# Patient Record
Sex: Male | Born: 1990 | Race: White | Hispanic: No | Marital: Single | Smoking: Current every day smoker
Health system: Southern US, Community
[De-identification: ages and names within clinical notes are randomized; demographics above are authoritative.]

## PROBLEM LIST (undated history)

## (undated) DIAGNOSIS — F419 Anxiety disorder, unspecified: Secondary | ICD-10-CM

## (undated) DIAGNOSIS — E860 Dehydration: Secondary | ICD-10-CM

## (undated) HISTORY — PX: FINGER SURGERY: SHX640

---
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
## 2011-03-21 ENCOUNTER — Emergency Department (HOSPITAL_COMMUNITY)
Admission: EM | Admit: 2011-03-21 | Discharge: 2011-03-22 | Disposition: A | Discharge status: Home / Self Care | Payer: No Typology Code available for payment source | Attending: Emergency Medicine | Admitting: Emergency Medicine

## 2011-03-21 ENCOUNTER — Emergency Department (HOSPITAL_COMMUNITY): Payer: No Typology Code available for payment source

## 2011-03-21 DIAGNOSIS — T07XXXA Unspecified multiple injuries, initial encounter: Secondary | ICD-10-CM | POA: Insufficient documentation

## 2011-03-21 DIAGNOSIS — M79609 Pain in unspecified limb: Secondary | ICD-10-CM | POA: Insufficient documentation

## 2011-03-21 DIAGNOSIS — IMO0002 Reserved for concepts with insufficient information to code with codable children: Secondary | ICD-10-CM | POA: Insufficient documentation

## 2011-03-21 DIAGNOSIS — M7989 Other specified soft tissue disorders: Secondary | ICD-10-CM | POA: Insufficient documentation

## 2011-03-21 DIAGNOSIS — R220 Localized swelling, mass and lump, head: Secondary | ICD-10-CM | POA: Insufficient documentation

## 2011-03-21 DIAGNOSIS — M25519 Pain in unspecified shoulder: Secondary | ICD-10-CM | POA: Insufficient documentation

## 2011-03-21 DIAGNOSIS — R51 Headache: Secondary | ICD-10-CM | POA: Insufficient documentation

## 2011-03-21 DIAGNOSIS — R404 Transient alteration of awareness: Secondary | ICD-10-CM | POA: Insufficient documentation

## 2011-03-21 DIAGNOSIS — S060X1A Concussion with loss of consciousness of 30 minutes or less, initial encounter: Secondary | ICD-10-CM | POA: Insufficient documentation

## 2011-03-28 LAB — HEMOGLOBIN AND HEMATOCRIT, BLOOD: HCT: 41.2 % (ref 36.0–49.0)

## 2011-06-05 ENCOUNTER — Emergency Department (HOSPITAL_COMMUNITY)
Admission: EM | Admit: 2011-06-05 | Discharge: 2011-06-05 | Disposition: A | Discharge status: Home / Self Care | Payer: BC Managed Care – PPO | Attending: Emergency Medicine | Admitting: Emergency Medicine

## 2011-06-05 DIAGNOSIS — F17200 Nicotine dependence, unspecified, uncomplicated: Secondary | ICD-10-CM | POA: Insufficient documentation

## 2011-06-05 DIAGNOSIS — R112 Nausea with vomiting, unspecified: Secondary | ICD-10-CM | POA: Insufficient documentation

## 2013-07-06 ENCOUNTER — Emergency Department (HOSPITAL_COMMUNITY)
Admission: EM | Admit: 2013-07-06 | Discharge: 2013-07-06 | Disposition: A | Discharge status: Home / Self Care | Payer: Self-pay | Attending: Emergency Medicine | Admitting: Emergency Medicine

## 2013-07-06 ENCOUNTER — Encounter (HOSPITAL_COMMUNITY): Payer: Self-pay | Admitting: *Deleted

## 2013-07-06 DIAGNOSIS — Y939 Activity, unspecified: Secondary | ICD-10-CM | POA: Insufficient documentation

## 2013-07-06 DIAGNOSIS — S39012A Strain of muscle, fascia and tendon of lower back, initial encounter: Secondary | ICD-10-CM

## 2013-07-06 DIAGNOSIS — X58XXXA Exposure to other specified factors, initial encounter: Secondary | ICD-10-CM | POA: Insufficient documentation

## 2013-07-06 DIAGNOSIS — F17200 Nicotine dependence, unspecified, uncomplicated: Secondary | ICD-10-CM | POA: Insufficient documentation

## 2013-07-06 DIAGNOSIS — Y929 Unspecified place or not applicable: Secondary | ICD-10-CM | POA: Insufficient documentation

## 2013-07-06 DIAGNOSIS — S335XXA Sprain of ligaments of lumbar spine, initial encounter: Secondary | ICD-10-CM | POA: Insufficient documentation

## 2013-07-06 MED ORDER — OXYCODONE-ACETAMINOPHEN 5-325 MG PO TABS
1.0000 | ORAL_TABLET | Freq: Once | ORAL | Status: AC
  Administered 2013-07-06: 1 via ORAL
  Filled 2013-07-06: qty 1

## 2013-07-06 MED ORDER — TRAMADOL HCL 50 MG PO TABS: 50.0000 mg | ORAL_TABLET | Freq: Four times a day (QID) | ORAL | Status: DC | PRN

## 2013-07-06 MED ORDER — CYCLOBENZAPRINE HCL 10 MG PO TABS
10.0000 mg | ORAL_TABLET | Freq: Once | ORAL | Status: AC
  Administered 2013-07-06: 10 mg via ORAL
  Filled 2013-07-06: qty 1

## 2013-07-06 MED ORDER — CYCLOBENZAPRINE HCL 10 MG PO TABS: 10.0000 mg | ORAL_TABLET | Freq: Three times a day (TID) | ORAL | Status: DC | PRN

## 2013-07-06 NOTE — ED Notes (Signed)
Back pain since yesterday when moving furniture. Alert, Nad

## 2013-07-06 NOTE — ED Provider Notes (Signed)
History    CSN: [id number] Arrival date & time 07/06/13  1411  First MD Initiated Contact with Patient 07/06/13 1432     Chief Complaint  Patient presents with  . Back Pain   (Consider location/radiation/quality/duration/timing/severity/associated sxs/prior Treatment) Patient is a 22 y.o. male presenting with back pain. The history is provided by the patient.  Back Pain Location:  Lumbar spine Quality:  Aching Radiates to:  Does not radiate Pain severity:  Moderate Pain is:  Same all the time Onset quality:  Gradual Duration:  2 weeks Timing:  Constant Chronicity:  Recurrent Context: not falling, not lifting heavy objects, not recent illness and not twisting   Relieved by:  Nothing Worsened by:  Ambulation, bending, twisting, movement and sitting Ineffective treatments:  OTC medications and NSAIDs Associated symptoms: no abdominal pain, no abdominal swelling, no bladder incontinence, no bowel incontinence, no chest pain, no dysuria, no fever, no headaches, no leg pain, no numbness, no paresthesias, no pelvic pain, no perianal numbness, no tingling and no weakness    History reviewed. No pertinent past medical history. Past Surgical History  Procedure Laterality Date  . Finger surgery     No family history on file. History  Substance Use Topics  . Smoking status: Current Every Day Smoker  . Smokeless tobacco: Not on file  . Alcohol Use: No    Review of Systems  Constitutional: Negative for fever.  Respiratory: Negative for shortness of breath.   Cardiovascular: Negative for chest pain.  Gastrointestinal: Negative for vomiting, abdominal pain, constipation and bowel incontinence.  Genitourinary: Negative for bladder incontinence, dysuria, hematuria, flank pain, decreased urine volume, difficulty urinating and pelvic pain.       No perineal numbness or incontinence of urine or feces  Musculoskeletal: Positive for back pain. Negative for joint swelling.  Skin: Negative  for rash.  Neurological: Negative for tingling, weakness, numbness, headaches and paresthesias.  All other systems reviewed and are negative.    Allergies  Review of patient's allergies indicates no known allergies.  Home Medications   Current Outpatient Rx  Name  Route  Sig  Dispense  Refill  . acetaminophen (TYLENOL) 500 MG tablet   Oral   Take 1,000 mg by mouth every 6 (six) hours as needed for pain.         Marland Kitchen ibuprofen (ADVIL,MOTRIN) 200 MG tablet   Oral   Take 800 mg by mouth every 6 (six) hours as needed for pain.         Marland Kitchen PRESCRIPTION MEDICATION   Oral   Take 1 tablet by mouth once. Pt took grandma medication. Pt does not know name of med. Med was for spasm.          BP 119/95  Pulse 87  Temp(Src) 98 F (36.7 C) (Oral)  Resp 20  Ht 5\' 10"  (1.778 m)  Wt 190 lb (86.183 kg)  BMI 27.26 kg/m2  SpO2 100% Physical Exam  Nursing note and vitals reviewed. Constitutional: He is oriented to person, place, and time. He appears well-developed and well-nourished. No distress.  HENT:  Head: Normocephalic and atraumatic.  Neck: Normal range of motion. Neck supple.  Cardiovascular: Normal rate, regular rhythm, normal heart sounds and intact distal pulses.   No murmur heard. Pulmonary/Chest: Effort normal and breath sounds normal. No respiratory distress.  Abdominal: Soft. He exhibits no distension. There is no tenderness. There is no rebound and no guarding.  Musculoskeletal: He exhibits tenderness. He exhibits no edema.  Lumbar back: He exhibits tenderness and pain. He exhibits normal range of motion, no swelling, no deformity, no laceration and normal pulse.  ttp of the  lumbar paraspinal muscles.  No spinal tenderness.  DP pulses are brisk and symmetrical.  Distal sensation intact.  Hip Flexors/Extensors are intact  Neurological: He is alert and oriented to person, place, and time. No cranial nerve deficit or sensory deficit. He exhibits normal muscle tone.  Coordination and gait normal.  Reflex Scores:      Patellar reflexes are 2+ on the right side and 2+ on the left side.      Achilles reflexes are 2+ on the right side and 2+ on the left side. Skin: Skin is warm and dry.    ED Course  Procedures (including critical care time) Labs Reviewed - No data to display   MDM     Patient reviewed on the [place] narcotics database.  No recent narcotics filled.     Patient has ttp of the lumbar spine and paraspinal muscles, has hx of chronic low back pain that been worsening for 2 weeks..  No focal neuro deficits on exam.  Ambulates with a steady gait.  Negative SLR bilaterally.  VSS, non-toxic appearing.  Appears stable for discharge.  Doubt emergent neurological or infectious process.  pt agrees to rest, ice and close f/u , resource info given.    Isyss Espinal L. Trisha Mangle, PA-C 07/07/13 2145

## 2013-07-06 NOTE — ED Notes (Signed)
Chronic back pain which is worse x 2 weeks.

## 2013-07-10 NOTE — ED Provider Notes (Signed)
Medical screening examination/treatment/procedure(s) were performed by non-physician practitioner and as supervising physician I was immediately available for consultation/collaboration.  Charles B. Sheldon, MD 07/10/13 1655 

## 2013-07-31 ENCOUNTER — Encounter (HOSPITAL_COMMUNITY): Payer: Self-pay | Admitting: Emergency Medicine

## 2013-07-31 ENCOUNTER — Emergency Department (HOSPITAL_COMMUNITY): Payer: Self-pay

## 2013-07-31 ENCOUNTER — Emergency Department (HOSPITAL_COMMUNITY)
Admission: EM | Admit: 2013-07-31 | Discharge: 2013-07-31 | Disposition: A | Discharge status: Home / Self Care | Payer: Self-pay | Attending: Emergency Medicine | Admitting: Emergency Medicine

## 2013-07-31 DIAGNOSIS — W230XXA Caught, crushed, jammed, or pinched between moving objects, initial encounter: Secondary | ICD-10-CM | POA: Insufficient documentation

## 2013-07-31 DIAGNOSIS — Z9889 Other specified postprocedural states: Secondary | ICD-10-CM | POA: Insufficient documentation

## 2013-07-31 DIAGNOSIS — S62639A Displaced fracture of distal phalanx of unspecified finger, initial encounter for closed fracture: Secondary | ICD-10-CM | POA: Insufficient documentation

## 2013-07-31 DIAGNOSIS — Y9289 Other specified places as the place of occurrence of the external cause: Secondary | ICD-10-CM | POA: Insufficient documentation

## 2013-07-31 DIAGNOSIS — F17200 Nicotine dependence, unspecified, uncomplicated: Secondary | ICD-10-CM | POA: Insufficient documentation

## 2013-07-31 DIAGNOSIS — S62606A Fracture of unspecified phalanx of right little finger, initial encounter for closed fracture: Secondary | ICD-10-CM

## 2013-07-31 DIAGNOSIS — Y9389 Activity, other specified: Secondary | ICD-10-CM | POA: Insufficient documentation

## 2013-07-31 MED ORDER — OXYCODONE-ACETAMINOPHEN 5-325 MG PO TABS
1.0000 | ORAL_TABLET | Freq: Once | ORAL | Status: AC
  Administered 2013-07-31: 1 via ORAL
  Filled 2013-07-31: qty 1

## 2013-07-31 MED ORDER — DICLOFENAC SODIUM 75 MG PO TBEC: 75.0000 mg | DELAYED_RELEASE_TABLET | Freq: Two times a day (BID) | ORAL | Status: DC

## 2013-07-31 MED ORDER — ONDANSETRON HCL 4 MG PO TABS
4.0000 mg | ORAL_TABLET | Freq: Once | ORAL | Status: AC
  Administered 2013-07-31: 4 mg via ORAL
  Filled 2013-07-31: qty 1

## 2013-07-31 MED ORDER — OXYCODONE-ACETAMINOPHEN 5-325 MG PO TABS: 1.0000 | ORAL_TABLET | Freq: Four times a day (QID) | ORAL | Status: DC | PRN

## 2013-07-31 MED ORDER — KETOROLAC TROMETHAMINE 10 MG PO TABS
10.0000 mg | ORAL_TABLET | Freq: Once | ORAL | Status: AC
  Administered 2013-07-31: 10 mg via ORAL
  Filled 2013-07-31: qty 1

## 2013-07-31 NOTE — ED Provider Notes (Signed)
CSN: [id number]     Arrival date & time 07/31/13  1704 History     First MD Initiated Contact with Patient 07/31/13 1808     Chief Complaint  Patient presents with  . Finger Injury   (Consider location/radiation/quality/duration/timing/severity/associated sxs/prior Treatment) Patient is a 22 y.o. male presenting with hand pain. The history is provided by the patient.  Hand Pain This is a new problem. The current episode started today. The problem occurs constantly. The problem has been gradually worsening. Pertinent negatives include no abdominal pain, arthralgias, chest pain, coughing, neck pain or numbness. Exacerbated by: movement and palpation. He has tried acetaminophen for the symptoms. The treatment provided no relief.    History reviewed. No pertinent past medical history. Past Surgical History  Procedure Laterality Date  . Finger surgery    . Finger surgery     No family history on file. History  Substance Use Topics  . Smoking status: Current Every Day Smoker  . Smokeless tobacco: Not on file  . Alcohol Use: No    Review of Systems  Constitutional: Negative for activity change.       All ROS Neg except as noted in HPI  HENT: Negative for nosebleeds and neck pain.   Eyes: Negative for photophobia and discharge.  Respiratory: Negative for cough, shortness of breath and wheezing.   Cardiovascular: Negative for chest pain and palpitations.  Gastrointestinal: Negative for abdominal pain and blood in stool.  Genitourinary: Negative for dysuria, frequency and hematuria.  Musculoskeletal: Negative for back pain and arthralgias.  Skin: Negative.   Neurological: Negative for dizziness, seizures, speech difficulty and numbness.  Psychiatric/Behavioral: Negative for hallucinations and confusion.    Allergies  Review of patient's allergies indicates no known allergies.  Home Medications   Current Outpatient Rx  Name  Route  Sig  Dispense  Refill  . acetaminophen  (TYLENOL) 500 MG tablet   Oral   Take 1,000 mg by mouth every 6 (six) hours as needed for pain.         . cyclobenzaprine (FLEXERIL) 10 MG tablet   Oral   Take 1 tablet (10 mg total) by mouth 3 (three) times daily as needed for muscle spasms.   21 tablet   0   . diclofenac (VOLTAREN) 75 MG EC tablet   Oral   Take 1 tablet (75 mg total) by mouth 2 (two) times daily.   12 tablet   0   . ibuprofen (ADVIL,MOTRIN) 200 MG tablet   Oral   Take 800 mg by mouth every 6 (six) hours as needed for pain.         Marland Kitchen oxyCODONE-acetaminophen (PERCOCET/ROXICET) 5-325 MG per tablet   Oral   Take 1 tablet by mouth every 6 (six) hours as needed for pain.   20 tablet   0   . PRESCRIPTION MEDICATION   Oral   Take 1 tablet by mouth once. Pt took grandma medication. Pt does not know name of med. Med was for spasm.         . traMADol (ULTRAM) 50 MG tablet   Oral   Take 1 tablet (50 mg total) by mouth every 6 (six) hours as needed for pain.   15 tablet   0    BP 138/72  Pulse 93  Temp(Src) 98.3 F (36.8 C) (Oral)  Resp 18  SpO2 100% Physical Exam  Nursing note and vitals reviewed. Constitutional: He is oriented to person, place, and time. He appears well-developed and  well-nourished.  Non-toxic appearance.  HENT:  Head: Normocephalic.  Right Ear: Tympanic membrane and external ear normal.  Left Ear: Tympanic membrane and external ear normal.  Eyes: EOM and lids are normal. Pupils are equal, round, and reactive to light.  Neck: Normal range of motion. Neck supple. Carotid bruit is not present.  Cardiovascular: Normal rate, regular rhythm, normal heart sounds, intact distal pulses and normal pulses.   Pulmonary/Chest: Breath sounds normal. No respiratory distress.  Abdominal: Soft. Bowel sounds are normal. There is no tenderness. There is no guarding.  Musculoskeletal: Normal range of motion.  There is full range of motion of the right shoulder elbow and wrist. The radial pulses 2+  on the right. There is pain and swelling of the DIP joint on the on the right. There is mild extension deformity appreciated. The capillary refill is less than 3 seconds.  Lymphadenopathy:       Head (right side): No submandibular adenopathy present.       Head (left side): No submandibular adenopathy present.    He has no cervical adenopathy.  Neurological: He is alert and oriented to person, place, and time. He has normal strength. No cranial nerve deficit or sensory deficit.  Skin: Skin is warm and dry.  Psychiatric: He has a normal mood and affect. His speech is normal.    ED Course   Procedures (including critical care time)  Labs Reviewed - No data to display Dg Finger Little Right  07/31/2013   *RADIOLOGY REPORT*  Clinical Data: Trauma.  Pain at distal interphalangeal joint.  RIGHT LITTLE FINGER 2+V  Comparison: None.  Findings: Soft tissue swelling centered about the distal interphalangeal joint.  A fracture involves the dorsal aspect of the proximal most portion of the distal phalanx.  This is intra- articular, minimally displaced.  IMPRESSION: Distal phalangeal fracture, intra-articular.   Original Report Authenticated By: Jeronimo Greaves, M.D.   1. Fracture of phalanx of right little finger, closed, initial encounter     MDM  **I have reviewed nursing notes, vital signs, and all appropriate lab and imaging results for this patient.* Patient had the right fifth finger caught in a car door earlier this morning. He notice more and more pain and swelling, as well as difficulty extending to the distal portion of the finger.  X-ray of the right fifth finger reveals a distal pharyngeal fracture that is intra-articular. The patient is fitted with a buddy tape splint. He is referred to orthopedics. The patient is advised to apply ice, he is given a prescription for diclofenac and Percocet.  Kathie Dike, PA-C 07/31/13 [phone]

## 2013-07-31 NOTE — ED Notes (Signed)
States that he injured his right 5th digit this morning, states that he caught his finger in his car door.

## 2013-08-03 ENCOUNTER — Emergency Department (HOSPITAL_COMMUNITY)
Admission: EM | Admit: 2013-08-03 | Discharge: 2013-08-03 | Disposition: A | Discharge status: Home / Self Care | Payer: Self-pay | Attending: Emergency Medicine | Admitting: Emergency Medicine

## 2013-08-03 ENCOUNTER — Encounter (HOSPITAL_COMMUNITY): Payer: Self-pay | Admitting: *Deleted

## 2013-08-03 ENCOUNTER — Telehealth: Payer: Self-pay | Admitting: Orthopedic Surgery

## 2013-08-03 DIAGNOSIS — M79609 Pain in unspecified limb: Secondary | ICD-10-CM | POA: Insufficient documentation

## 2013-08-03 DIAGNOSIS — R209 Unspecified disturbances of skin sensation: Secondary | ICD-10-CM | POA: Insufficient documentation

## 2013-08-03 DIAGNOSIS — S62609S Fracture of unspecified phalanx of unspecified finger, sequela: Secondary | ICD-10-CM

## 2013-08-03 DIAGNOSIS — F17200 Nicotine dependence, unspecified, uncomplicated: Secondary | ICD-10-CM | POA: Insufficient documentation

## 2013-08-03 MED ORDER — OXYCODONE-ACETAMINOPHEN 5-325 MG PO TABS: 1.0000 | ORAL_TABLET | ORAL | Status: DC | PRN

## 2013-08-03 NOTE — Telephone Encounter (Signed)
Received call from Burgess Amor, [place], at [facility] : [facility] Emergency department, states "Courtesy call" regarding this patient, and follow up for appointment for finger injury/fracture, for which patient presents again today, 08/03/13, to the Emergency room. I relayed that I did not see a phone note in which we spoke with him in the medical chart, but assured her if we haven't scheduled, we would be glad to schedule first available, and would be needed to verify insurance and/or all information needed for the appointment.  With further research, noted that patient is scheduled here for an appointment with Dr Romeo Apple on 08/10/13.  I have tried calling patient back to offer appointment for an earlier date, in a cancellation slot; per ph# in [facility] chart - ph # [phone], it is a non-working number.  I have in turn called and left a voice message at the alternate contact phone # [phone], to follow up with our office regarding the appointment.

## 2013-08-03 NOTE — Progress Notes (Signed)
ED/CM noted pt did not have health insurance, and/or PCP. Patient was given the ED [facility] uninsured handout with information for the clinics, food pantries, and the handout for insurance sign-up. Patient expressed appreciation for this.

## 2013-08-03 NOTE — ED Provider Notes (Signed)
Medical screening examination/treatment/procedure(s) were performed by non-physician practitioner and as supervising physician I was immediately available for consultation/collaboration.   Sosha Shepherd M Harmoney Sienkiewicz, DO 08/03/13 2219 

## 2013-08-03 NOTE — ED Provider Notes (Signed)
CSN: [id number]     Arrival date & time 08/03/13  1335 History     First MD Initiated Contact with Patient 08/03/13 1349     Chief Complaint  Patient presents with  . Finger Injury   (Consider location/radiation/quality/duration/timing/severity/associated sxs/prior Treatment) HPI Comments: Troy Clayton is a 22 y.o. Male pending for a recheck of his right fifth finger fracture for which he was seen here 3 days ago.  He reports constant but not worsened pain at the site and has run out of his oxycodone.  He is still taking voltaren which is not relieving his pain.  He has tried to get in with Dr. Romeo Apple for further management of this injury,  But was told it will be next week before he can get seen.  He has persistent pain described as throbbing but does not radiate.  He also has some decreased sensation to fine touch in the distal fingertip.      The history is provided by the patient.    History reviewed. No pertinent past medical history. Past Surgical History  Procedure Laterality Date  . Finger surgery    . Finger surgery     No family history on file. History  Substance Use Topics  . Smoking status: Current Every Day Smoker  . Smokeless tobacco: Not on file  . Alcohol Use: No    Review of Systems  Constitutional: Negative for fever.  Musculoskeletal: Positive for arthralgias. Negative for myalgias and joint swelling.  Neurological: Positive for numbness. Negative for weakness.    Allergies  Review of patient's allergies indicates no known allergies.  Home Medications   Current Outpatient Rx  Name  Route  Sig  Dispense  Refill  . acetaminophen (TYLENOL) 500 MG tablet   Oral   Take 1,000 mg by mouth every 6 (six) hours as needed for pain.         Marland Kitchen diclofenac (VOLTAREN) 75 MG EC tablet   Oral   Take 1 tablet (75 mg total) by mouth 2 (two) times daily.   12 tablet   0   . ibuprofen (ADVIL,MOTRIN) 200 MG tablet   Oral   Take 800 mg by mouth every 6  (six) hours as needed for pain.         Marland Kitchen oxyCODONE-acetaminophen (PERCOCET/ROXICET) 5-325 MG per tablet   Oral   Take 1 tablet by mouth every 6 (six) hours as needed for pain.   20 tablet   0    BP 111/60  Pulse 71  Temp(Src) 97.3 F (36.3 C) (Oral)  Resp 20  Ht 5\' 10"  (1.778 m)  Wt 190 lb (86.183 kg)  BMI 27.26 kg/m2  SpO2 100% Physical Exam  Constitutional: He appears well-developed and well-nourished.  HENT:  Head: Atraumatic.  Neck: Normal range of motion.  Cardiovascular:  Pulses equal bilaterally  Musculoskeletal: He exhibits tenderness.  ttp right 5th finger at dip joint, mild edema.  Less than 3 sec cap refill.  Patient has sensation in finger tip,  Reduced compared to the 4th.    Neurological: He is alert. He has normal strength. He displays normal reflexes. No sensory deficit.  Equal strength  Skin: Skin is warm and dry.  Psychiatric: He has a normal mood and affect.    ED Course   Procedures (including critical care time)  Labs Reviewed - No data to display No results found. No diagnosis found.  MDM  Patients labs and/or radiological studies were viewed and considered during  the medical decision making and disposition process. Xray from prior visit reviewed.  Call placed to Dr Harrison's office to attempt to get appt time - per Okey Regal, no record of patient having called.  Encouraged pt to call for definitive tx of this injury.  He states he will call today for this.  He was placed in a new finger splint.  #10 oxycodone,  Continue with voltaren,  Ice, elevation.  The patient appears reasonably screened and/or stabilized for discharge and I doubt any other medical condition or other [facility] requiring further screening, evaluation, or treatment in the ED at this time prior to discharge.   Burgess Amor, PA-C 08/03/13 1623

## 2013-08-03 NOTE — ED Notes (Signed)
Recheck of fx to right 5th finger. Pt states he is unable to get in to see ortho for another week or two and unable to set up an appt, per pt. States finger is still throbbing.

## 2013-08-05 NOTE — ED Provider Notes (Signed)
History/physical exam/procedure(s) were performed by non-physician practitioner and as supervising physician I was immediately available for consultation/collaboration. I have reviewed all notes and am in agreement with care and plan.   Azalie Harbeck S Ramey Ketcherside, MD 08/05/13 1242 

## 2013-08-10 ENCOUNTER — Encounter: Payer: Self-pay | Admitting: Orthopedic Surgery

## 2013-08-10 ENCOUNTER — Ambulatory Visit (INDEPENDENT_AMBULATORY_CARE_PROVIDER_SITE_OTHER): Payer: Self-pay | Admitting: Orthopedic Surgery

## 2013-08-10 VITALS — BP 122/96 | Ht 70.0 in | Wt 204.0 lb

## 2013-08-10 DIAGNOSIS — M20019 Mallet finger of unspecified finger(s): Secondary | ICD-10-CM

## 2013-08-10 DIAGNOSIS — M20011 Mallet finger of right finger(s): Secondary | ICD-10-CM

## 2013-08-10 MED ORDER — HYDROCODONE-ACETAMINOPHEN 5-325 MG PO TABS: 1.0000 | ORAL_TABLET | ORAL | Status: DC | PRN

## 2013-08-10 NOTE — Patient Instructions (Addendum)
Mallet finger  Mallet Finger You have a hand injury called mallet finger. This means that the tendon which straightens the fingertip is torn. This injury usually results from jamming the end of the finger. A small fracture at the joint may also be present. Most of the time, this injury can be treated with a simple splint. Keep your finger elevated and use an icepack on it every 2-3 hours for several days to help reduce pain and to keep the swelling down. Although the injury often causes a deformity at the joint, surgery is not needed if a splint is used properly. A properly fit special finger splint will hold the tendon ends together until the tendon is healed. You must wear this splint continuously for at least 6-8 weeks, or you may end up with a permanent deformity. If the end joint of the finger bends at any time before healing is complete, the treatment may fail. Please do not remove this splint until your doctor approves. Be sure to see the doctor for follow-up care as recommended. If your finger becomes more painful, swollen, or red, you should see your doctor right away. MAKE SURE YOU:   Understand these instructions.  Will watch your condition.  Will get help right away if you are not doing well or get worse. Document Released: 01/15/2005 Document Revised: 03/01/2012 Document Reviewed: 12/08/2005 [facility] Patient Information 2014 [place], [place].

## 2013-08-10 NOTE — Progress Notes (Signed)
Patient ID: Troy Clayton, male   DOB: 15-Jun-1991, 22 y.o.   MRN: [id number]  Chief Complaint  Patient presents with  . Hand Pain    Right pinky finger broken d/t injury 07/31/13.    Right hand injury August 10 Initial visit to the hospital repeat visit on August 15 Symptoms sharp throbbing constant pain Pain level X out of 10 questionable Complains of tingling locking swelling numbness deformity  Review of systems positive for nervousness anxiety stiffness joint pain denies shortness of breath chest pain or frequency  Past Surgical History  Procedure Laterality Date  . Finger surgery    . Finger surgery      No past medical history on file.  BP 122/96  Ht 5\' 10"  (1.778 m)  Wt 204 lb (92.534 kg)  BMI 29.27 kg/m2  General appearance is normal he is oriented x3 his mood is normal his gait is normal his right DIP joint is in flexion has no active extension joint is stable skin is intact passive range of motion is normal he is tender over the DIP joint capillary refill is normal sensation is normal x-ray shows bony mallet less than 50% of the joint less than 30% of the joint actually  Splint  OT for mallet finger protocol Splint constantly for 6 weeks and 4 weeks of nighttime splinting Return 6 weeks  Bony mallet injury right small finger DIP joint

## 2013-08-10 NOTE — Telephone Encounter (Signed)
Patient came today, 08/10/13 for originally scheduled appointment.  No call back received to previous calls.

## 2013-08-17 ENCOUNTER — Ambulatory Visit (HOSPITAL_COMMUNITY)
Admission: RE | Admit: 2013-08-17 | Discharge: 2013-08-17 | Disposition: A | Discharge status: Home / Self Care | Payer: Self-pay | Source: Ambulatory Visit | Attending: Orthopedic Surgery | Admitting: Orthopedic Surgery

## 2013-08-17 DIAGNOSIS — M79609 Pain in unspecified limb: Secondary | ICD-10-CM | POA: Insufficient documentation

## 2013-08-17 DIAGNOSIS — M20019 Mallet finger of unspecified finger(s): Secondary | ICD-10-CM | POA: Insufficient documentation

## 2013-08-17 DIAGNOSIS — IMO0001 Reserved for inherently not codable concepts without codable children: Secondary | ICD-10-CM | POA: Insufficient documentation

## 2013-08-17 DIAGNOSIS — Z4689 Encounter for fitting and adjustment of other specified devices: Secondary | ICD-10-CM | POA: Insufficient documentation
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
## 2013-08-24 ENCOUNTER — Other Ambulatory Visit: Payer: Self-pay | Admitting: *Deleted

## 2013-08-24 DIAGNOSIS — M20011 Mallet finger of right finger(s): Secondary | ICD-10-CM

## 2013-08-24 MED ORDER — HYDROCODONE-ACETAMINOPHEN 5-325 MG PO TABS: 1.0000 | ORAL_TABLET | ORAL | Status: DC | PRN

## 2013-09-20 ENCOUNTER — Encounter: Payer: Self-pay | Admitting: Orthopedic Surgery

## 2013-09-20 ENCOUNTER — Ambulatory Visit (INDEPENDENT_AMBULATORY_CARE_PROVIDER_SITE_OTHER): Payer: Self-pay | Admitting: Orthopedic Surgery

## 2013-09-20 VITALS — BP 153/87 | Ht 70.0 in | Wt 204.0 lb

## 2013-09-20 DIAGNOSIS — M20011 Mallet finger of right finger(s): Secondary | ICD-10-CM

## 2013-09-20 DIAGNOSIS — M20019 Mallet finger of unspecified finger(s): Secondary | ICD-10-CM

## 2013-09-20 MED ORDER — HYDROCODONE-ACETAMINOPHEN 5-325 MG PO TABS: 1.0000 | ORAL_TABLET | ORAL | Status: DC | PRN

## 2013-09-20 NOTE — Progress Notes (Signed)
Patient ID: Troy Clayton, male   DOB: 07/07/1991, 22 y.o.   MRN: [id number]  Chief Complaint  Patient presents with  . Follow-up    6 week recheck right hand mallet finger DOI 07/31/13    Followup right small finger mallet deformity. He should splinting as asked. He still has a slight flexion deformity at the tip of the finger some soreness at the DIP joint  Recommend nighttime splinting for 4 weeks come back for recheck in final evaluation  Encounter Diagnosis  Name Primary?  . Mallet finger, right Yes    Meds ordered this encounter  Medications  . HYDROcodone-acetaminophen (NORCO/VICODIN) 5-325 MG per tablet    Sig: Take 1 tablet by mouth every 4 (four) hours as needed for pain.    Dispense:  80 tablet    Refill:  0

## 2013-10-03 ENCOUNTER — Encounter: Payer: Self-pay | Admitting: Orthopedic Surgery

## 2013-10-12 ENCOUNTER — Telehealth: Payer: Self-pay | Admitting: Orthopedic Surgery

## 2013-10-12 NOTE — Telephone Encounter (Signed)
Troy Clayton asked for a prescriptoin for Hydrocodone.  He has an appointment 11/03/13 (resch from 10/18/13)  His # [phone]

## 2013-10-13 ENCOUNTER — Other Ambulatory Visit: Payer: Self-pay | Admitting: Orthopedic Surgery

## 2013-10-13 MED ORDER — ACETAMINOPHEN-CODEINE #3 300-30 MG PO TABS: 1.0000 | ORAL_TABLET | ORAL | Status: DC | PRN

## 2013-10-13 NOTE — Telephone Encounter (Signed)
Left message for [place] to call office

## 2013-10-13 NOTE — Telephone Encounter (Signed)
Patient picked up prescription.

## 2013-10-18 ENCOUNTER — Telehealth: Payer: Self-pay | Admitting: Orthopedic Surgery

## 2013-10-18 ENCOUNTER — Ambulatory Visit: Payer: Self-pay | Admitting: Orthopedic Surgery

## 2013-10-18 NOTE — Telephone Encounter (Signed)
Tammy, please call Matheu Ploeger at [phone], he has a question about his medicine

## 2013-10-18 NOTE — Telephone Encounter (Signed)
Returned call to patient, and he is c/o Tylenol #3 is making him sick. He states it is making him vomit. He states he is trying to work, but finger is still swelling and hurting. He wants to know if he can get prescription for hydrocodone 5/325? I advised him that I could send you the note, and if you approve, he will have to come in and pick up written prescription and show photo Id. His next appointment with you is 11/03/13.

## 2013-10-19 ENCOUNTER — Other Ambulatory Visit: Payer: Self-pay | Admitting: *Deleted

## 2013-10-19 DIAGNOSIS — M20011 Mallet finger of right finger(s): Secondary | ICD-10-CM

## 2013-10-19 MED ORDER — IBUPROFEN 800 MG PO TABS: 800.0000 mg | ORAL_TABLET | Freq: Three times a day (TID) | ORAL | Status: DC | PRN

## 2013-10-19 MED ORDER — TRAMADOL-ACETAMINOPHEN 37.5-325 MG PO TABS: 1.0000 | ORAL_TABLET | ORAL | Status: DC | PRN

## 2013-10-19 NOTE — Telephone Encounter (Signed)
Tammy, spoke with Domingo Dimes, he uses [facility]

## 2013-10-19 NOTE — Telephone Encounter (Signed)
No hydrocodone   ultracet 1 po q 4 prn pain # 60 1 refill  Ibuprofen 800 q 8 hrs

## 2013-10-19 NOTE — Telephone Encounter (Signed)
Prescriptions sent to pharmacy.  Patient aware 

## 2013-11-03 ENCOUNTER — Ambulatory Visit: Payer: Self-pay | Admitting: Orthopedic Surgery

## 2013-11-03 ENCOUNTER — Encounter: Payer: Self-pay | Admitting: Orthopedic Surgery

## 2014-04-08 ENCOUNTER — Emergency Department (HOSPITAL_COMMUNITY)
Admission: EM | Admit: 2014-04-08 | Discharge: 2014-04-08 | Disposition: A | Discharge status: Home / Self Care | Payer: Self-pay | Attending: Emergency Medicine | Admitting: Emergency Medicine

## 2014-04-08 ENCOUNTER — Encounter (HOSPITAL_COMMUNITY): Payer: Self-pay | Admitting: Emergency Medicine

## 2014-04-08 ENCOUNTER — Emergency Department (HOSPITAL_COMMUNITY): Payer: Self-pay

## 2014-04-08 DIAGNOSIS — S20212A Contusion of left front wall of thorax, initial encounter: Secondary | ICD-10-CM

## 2014-04-08 DIAGNOSIS — IMO0002 Reserved for concepts with insufficient information to code with codable children: Secondary | ICD-10-CM | POA: Insufficient documentation

## 2014-04-08 DIAGNOSIS — R05 Cough: Secondary | ICD-10-CM | POA: Insufficient documentation

## 2014-04-08 DIAGNOSIS — S20219A Contusion of unspecified front wall of thorax, initial encounter: Secondary | ICD-10-CM | POA: Insufficient documentation

## 2014-04-08 DIAGNOSIS — F17200 Nicotine dependence, unspecified, uncomplicated: Secondary | ICD-10-CM | POA: Insufficient documentation

## 2014-04-08 DIAGNOSIS — Z791 Long term (current) use of non-steroidal anti-inflammatories (NSAID): Secondary | ICD-10-CM | POA: Insufficient documentation

## 2014-04-08 DIAGNOSIS — R0602 Shortness of breath: Secondary | ICD-10-CM | POA: Insufficient documentation

## 2014-04-08 DIAGNOSIS — R059 Cough, unspecified: Secondary | ICD-10-CM | POA: Insufficient documentation

## 2014-04-08 MED ORDER — NAPROXEN 500 MG PO TABS: 500.0000 mg | ORAL_TABLET | Freq: Two times a day (BID) | ORAL | Status: DC

## 2014-04-08 MED ORDER — HYDROCODONE-ACETAMINOPHEN 5-325 MG PO TABS: 1.0000 | ORAL_TABLET | ORAL | Status: DC | PRN

## 2014-04-08 MED ORDER — OXYCODONE-ACETAMINOPHEN 5-325 MG PO TABS
1.0000 | ORAL_TABLET | Freq: Once | ORAL | Status: AC
  Administered 2014-04-08: 1 via ORAL
  Filled 2014-04-08: qty 1
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
## 2014-04-08 NOTE — Discharge Instructions (Signed)
Your x-ray shows no rib fracture and your lungs are normal. Take the medication as directed. Do not drive if taking the narcotic as it will make you sleepy. Return as needed for problems.
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
## 2014-04-08 NOTE — ED Notes (Signed)
Pt evaluated and seen by Kerrie BuffaloHope Neese, NP

## 2014-04-09 NOTE — ED Provider Notes (Signed)
  Medical screening examination/treatment/procedure(s) were performed by non-physician practitioner and as supervising physician I was immediately available for consultation/collaboration.     Gerhard Munchobert Alem Fahl, MD 04/09/14 2312

## 2014-05-14 ENCOUNTER — Encounter (HOSPITAL_COMMUNITY): Payer: Self-pay | Admitting: Emergency Medicine

## 2014-05-14 ENCOUNTER — Emergency Department (HOSPITAL_COMMUNITY): Payer: Self-pay

## 2014-05-14 ENCOUNTER — Emergency Department (HOSPITAL_COMMUNITY)
Admission: EM | Admit: 2014-05-14 | Discharge: 2014-05-14 | Disposition: A | Discharge status: Home / Self Care | Payer: Self-pay | Attending: Emergency Medicine | Admitting: Emergency Medicine

## 2014-05-14 DIAGNOSIS — T07XXXA Unspecified multiple injuries, initial encounter: Secondary | ICD-10-CM | POA: Insufficient documentation

## 2014-05-14 DIAGNOSIS — F17200 Nicotine dependence, unspecified, uncomplicated: Secondary | ICD-10-CM | POA: Insufficient documentation

## 2014-05-14 DIAGNOSIS — Y9351 Activity, roller skating (inline) and skateboarding: Secondary | ICD-10-CM | POA: Insufficient documentation

## 2014-05-14 DIAGNOSIS — IMO0002 Reserved for concepts with insufficient information to code with codable children: Secondary | ICD-10-CM | POA: Insufficient documentation

## 2014-05-14 DIAGNOSIS — Y929 Unspecified place or not applicable: Secondary | ICD-10-CM | POA: Insufficient documentation

## 2014-05-14 MED ORDER — OXYCODONE-ACETAMINOPHEN 5-325 MG PO TABS
1.0000 | ORAL_TABLET | Freq: Once | ORAL | Status: AC
  Administered 2014-05-14: 1 via ORAL
  Filled 2014-05-14: qty 1

## 2014-05-14 MED ORDER — CYCLOBENZAPRINE HCL 10 MG PO TABS: 10.0000 mg | ORAL_TABLET | Freq: Three times a day (TID) | ORAL | Status: DC | PRN

## 2014-05-14 MED ORDER — CYCLOBENZAPRINE HCL 10 MG PO TABS
10.0000 mg | ORAL_TABLET | Freq: Once | ORAL | Status: AC
  Administered 2014-05-14: 10 mg via ORAL
  Filled 2014-05-14: qty 1

## 2014-05-14 MED ORDER — SILVER SULFADIAZINE 1 % EX CREA
TOPICAL_CREAM | Freq: Once | CUTANEOUS | Status: DC
  Filled 2014-05-14: qty 50

## 2014-05-14 MED ORDER — SILVER SULFADIAZINE 1 % EX CREA: 1.0000 "application " | TOPICAL_CREAM | Freq: Every day | CUTANEOUS | Status: DC

## 2014-05-14 MED ORDER — NAPROXEN 500 MG PO TABS: 500.0000 mg | ORAL_TABLET | Freq: Two times a day (BID) | ORAL | Status: DC

## 2014-05-14 MED ORDER — HYDROCODONE-ACETAMINOPHEN 5-325 MG PO TABS: 1.0000 | ORAL_TABLET | Freq: Four times a day (QID) | ORAL | Status: DC | PRN
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
## 2014-05-14 NOTE — ED Notes (Signed)
Wound care to left upper arm and left lower leg with silvadene, telfa, and kling.
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
## 2014-05-14 NOTE — Discharge Instructions (Signed)
Ice packs to the injured areas. Keep the abrasions covered with the silvadene ointment, change the dressing daily. When the silvadene runs out you can use triple antibiotic ointment. Take the medications as prescribed. Wear the finger splint. You can be rechecked by one of the orthopedists in [place] later this week. Call the office to get an appointment. Return to the ED for any problems listed on the head injury sheet.  Abrasion An abrasion is a cut or scrape of the skin. Abrasions do not extend through all layers of the skin and most heal within 10 days. It is important to care for your abrasion properly to prevent infection. CAUSES  Most abrasions are caused by falling on, or gliding across, the ground or other surface. When your skin rubs on something, the outer and inner layer of skin rubs off, causing an abrasion. DIAGNOSIS  Your caregiver will be able to diagnose an abrasion during a physical exam.  TREATMENT  Your treatment depends on how large and deep the abrasion is. Generally, your abrasion will be cleaned with water and a mild soap to remove any dirt or debris. An antibiotic ointment may be put over the abrasion to prevent an infection. A bandage (dressing) may be wrapped around the abrasion to keep it from getting dirty.  You may need a tetanus shot if:  You cannot remember when you had your last tetanus shot.  You have never had a tetanus shot.  The injury broke your skin. If you get a tetanus shot, your arm may swell, get red, and feel warm to the touch. This is common and not a problem. If you need a tetanus shot and you choose not to have one, there is a rare chance of getting tetanus. Sickness from tetanus can be serious.  HOME CARE INSTRUCTIONS   If a dressing was applied, change it at least once a day or as directed by your caregiver. If the bandage sticks, soak it off with warm water.   Wash the area with water and a mild soap to remove all the ointment 2 times a day.  Rinse off the soap and pat the area dry with a clean towel.   Reapply any ointment as directed by your caregiver. This will help prevent infection and keep the bandage from sticking. Use gauze over the wound and under the dressing to help keep the bandage from sticking.   Change your dressing right away if it becomes wet or dirty.   Only take over-the-counter or prescription medicines for pain, discomfort, or fever as directed by your caregiver.   Follow up with your caregiver within 24 48 hours for a wound check, or as directed. If you were not given a wound-check appointment, look closely at your abrasion for redness, swelling, or pus. These are signs of infection. SEEK IMMEDIATE MEDICAL CARE IF:   You have increasing pain in the wound.   You have redness, swelling, or tenderness around the wound.   You have pus coming from the wound.   You have a fever or persistent symptoms for more than 2 3 days.  You have a fever and your symptoms suddenly get worse.  You have a bad smell coming from the wound or dressing.  MAKE SURE YOU:   Understand these instructions.  Will watch your condition.  Will get help right away if you are not doing well or get worse. Document Released: 09/17/2005 Document Revised: 11/24/2012 Document Reviewed: 11/11/2011 [facility] Patient Information 2014 [place], [place].  Contusion A contusion is a deep bruise. Contusions are the result of an injury that caused bleeding under the skin. The contusion may turn blue, purple, or yellow. Minor injuries will give you a painless contusion, but more severe contusions may stay painful and swollen for a few weeks.  CAUSES  A contusion is usually caused by a blow, trauma, or direct force to an area of the body. SYMPTOMS   Swelling and redness of the injured area.  Bruising of the injured area.  Tenderness and soreness of the injured area.  Pain. DIAGNOSIS  The diagnosis can be made by taking a history and  physical exam. An X-ray, CT scan, or MRI may be needed to determine if there were any associated injuries, such as fractures. TREATMENT  Specific treatment will depend on what area of the body was injured. In general, the best treatment for a contusion is resting, icing, elevating, and applying cold compresses to the injured area. Over-the-counter medicines may also be recommended for pain control. Ask your caregiver what the best treatment is for your contusion. HOME CARE INSTRUCTIONS   Put ice on the injured area.  Put ice in a plastic bag.  Place a towel between your skin and the bag.  Leave the ice on for 15-20 minutes, 03-04 times a day.  Only take over-the-counter or prescription medicines for pain, discomfort, or fever as directed by your caregiver. Your caregiver may recommend avoiding anti-inflammatory medicines (aspirin, ibuprofen, and naproxen) for 48 hours because these medicines may increase bruising.  Rest the injured area.  If possible, elevate the injured area to reduce swelling. SEEK IMMEDIATE MEDICAL CARE IF:   You have increased bruising or swelling.  You have pain that is getting worse.  Your swelling or pain is not relieved with medicines. MAKE SURE YOU:   Understand these instructions.  Will watch your condition.  Will get help right away if you are not doing well or get worse. Document Released: 09/17/2005 Document Revised: 03/01/2012 Document Reviewed: 10/13/2011 [facility] Patient Information 2014 [place], [place].
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
## 2014-06-12 ENCOUNTER — Encounter (HOSPITAL_COMMUNITY): Payer: Self-pay | Admitting: Emergency Medicine

## 2014-06-12 ENCOUNTER — Emergency Department (HOSPITAL_COMMUNITY)
Admission: EM | Admit: 2014-06-12 | Discharge: 2014-06-12 | Disposition: A | Discharge status: Home / Self Care | Payer: Self-pay | Attending: Emergency Medicine | Admitting: Emergency Medicine

## 2014-06-12 DIAGNOSIS — K011 Impacted teeth: Secondary | ICD-10-CM

## 2014-06-12 DIAGNOSIS — K006 Disturbances in tooth eruption: Secondary | ICD-10-CM | POA: Insufficient documentation

## 2014-06-12 DIAGNOSIS — K029 Dental caries, unspecified: Secondary | ICD-10-CM | POA: Insufficient documentation

## 2014-06-12 DIAGNOSIS — F17200 Nicotine dependence, unspecified, uncomplicated: Secondary | ICD-10-CM | POA: Insufficient documentation

## 2014-06-12 MED ORDER — AMOXICILLIN 500 MG PO CAPS: 500.0000 mg | ORAL_CAPSULE | Freq: Three times a day (TID) | ORAL | Status: DC

## 2014-06-12 MED ORDER — HYDROCODONE-ACETAMINOPHEN 5-325 MG PO TABS: ORAL_TABLET | ORAL | Status: DC

## 2014-06-12 MED ORDER — AMOXICILLIN 250 MG PO CAPS
500.0000 mg | ORAL_CAPSULE | Freq: Once | ORAL | Status: AC
  Administered 2014-06-12: 500 mg via ORAL
  Filled 2014-06-12: qty 2

## 2014-06-12 MED ORDER — HYDROCODONE-ACETAMINOPHEN 5-325 MG PO TABS
1.0000 | ORAL_TABLET | Freq: Once | ORAL | Status: AC
  Administered 2014-06-12: 1 via ORAL
  Filled 2014-06-12: qty 1

## 2014-06-12 NOTE — Discharge Instructions (Signed)
Impacted Molar °Molars are the teeth in the back of your mouth. You have 12 molars. There are 6 molars in each jaw, 3 on each side. When they grow in (erupt) they sometimes cause problems. Molars trapped inside the gum are impacted molars. Impacted molars may grow sideways, tilted, or may only partially emerge. °Molars erupt at different times in life. The first set of molars usually erupts around 6 to 22 years of age. The second set of molars typically erupts around 11 to 22 years of age. The third set of molars are called wisdom teeth. These molars usually do not have enough space to erupt properly. Many teens and young adults develop impacted wisdom teeth and have them surgically removed (extracted). However, any molar or set of molars may become impacted. °CAUSES  °Teeth that are crowded are often the reason for an impacted molar, but sometimes a cyst or tumor may cause impaction of molars. °SYMPTOMS  °Sometimes there are no symptoms and an impacted molar is noticed during an exam or X-ray. If there are symptoms they may include: °· Pain. °· Swelling, redness, or inflammation near the impacted tooth or teeth. °· Stiff jaw. °· General feeling of illness. °· Bad breath. °· Gap between the teeth. °· Difficulty opening your mouth. °· Headache or jaw ache. °· Swollen lymph nodes.  °Impacted teeth may increase the risk of complications such as: °· Infection, with possible drainage around the infected area. °· Damage to nearby teeth. °· Growth of cysts. °· Chronic discomfort. °DIAGNOSIS  °Impacted molars are diagnosed by oral exam and X-rays. °TREATMENT  °The goal of treatment is to obtain the best possible arrangement of your teeth. Your dentist or orthodontist will recommend the best course of action for you. After an exam, your caregiver may recommend one or a combination of the following treatments. °· Supportive home care to manage pain and other symptoms until treatment can be started.  °· Surgical extraction of  one or a combination of molars to leave room for emerging or later molars. Teeth must be extracted at appropriate times for the best results. °· Surgical uncovering of tissue covering the impacted molar. °· Orthodontic repositioning with the use of appliances such as elastic or metal separators, braces, wires, springs, and other removable or fixed devices. This is done to guide the molar and surrounding teeth to grow in properly. In some cases, you may need some surgery to assist this procedure. Follow-up orthodontic treatment is often necessary with impacted first and second molars. °· Antibiotics to treat infection. °HOME CARE INSTRUCTIONS °Rinse as directed with an antibacterial solution or salt and warm water. Follow up with your caregiver as directed, even if you do not have symptoms. If you are waiting for treatment and have pain: °· Take pain medicines as directed. °· Take your antibiotics as directed. Finish them even if you start to feel better. °· Put ice on the affected area. °¨ Put ice in a plastic bag. °¨ Place a towel between your skin and the bag. °¨ Leave the ice on for 15-20 minutes, 03-04 times a day. °SEEK DENTAL CARE IF: °· You have a fever. °· Pain emerges, worsens, or is not controlled by the medicines you were given. °· Swelling occurs. °· You have difficulty opening your mouth or swallowing. °MAKE SURE YOU:  °· Understand these instructions. °· Will watch your condition. °· Will get help right away if you are not doing well or get worse. °Document Released: 08/06/2011 Document Revised: 03/01/2012 Document Reviewed:   08/06/2011 °Exit[employer]® Patient Information ©2015 [employer]. This information is not intended to replace advice given to you by your health care provider. Make sure you discuss any questions you have with your health care provider. ° °

## 2014-06-12 NOTE — ED Provider Notes (Signed)
CSN: [id number]     Arrival date & time 06/12/14  2046 History   First MD Initiated Contact with Patient 06/12/14 2115     Chief Complaint  Patient presents with  . Dental Pain     (Consider location/radiation/quality/duration/timing/severity/associated sxs/prior Treatment) Patient is a 23 y.o. male presenting with tooth pain. The history is provided by the patient.  Dental Pain Location:  Lower Lower teeth location:  32/RL 3rd molar Quality:  Throbbing and shooting Severity:  Severe Onset quality:  Gradual Duration:  2 days Timing:  Constant Progression:  Worsening Chronicity:  New Context: not abscess, not dental caries, normal dentition and not trauma   Context comment:  Wisdom teeth Relieved by:  Nothing Exacerbated by: chewing. Ineffective treatments:  NSAIDs and acetaminophen Associated symptoms: facial pain and gum swelling   Associated symptoms: no congestion, no difficulty swallowing, no drooling, no facial swelling, no fever, no headaches, no neck pain, no neck swelling, no oral bleeding, no oral lesions and no trismus   Risk factors: lack of dental care and smoking   Risk factors: no diabetes     History reviewed. No pertinent past medical history. Past Surgical History  Procedure Laterality Date  . Finger surgery    . Finger surgery     History reviewed. No pertinent family history. History  Substance Use Topics  . Smoking status: Current Every Day Smoker -- 1.00 packs/day for 3 years    Types: Cigarettes  . Smokeless tobacco: Never Used  . Alcohol Use: No    Review of Systems  Constitutional: Negative for fever and appetite change.  HENT: Positive for dental problem. Negative for congestion, drooling, facial swelling, mouth sores, sore throat and trouble swallowing.   Eyes: Negative for pain and visual disturbance.  Musculoskeletal: Negative for neck pain and neck stiffness.  Neurological: Negative for dizziness, facial asymmetry and headaches.    Hematological: Negative for adenopathy.  All other systems reviewed and are negative.     Allergies  Review of patient's allergies indicates no known allergies.  Home Medications   Prior to Admission medications   Medication Sig Start Date End Date Taking? Authorizing Provider  acetaminophen (TYLENOL) 500 MG tablet Take 1,000 mg by mouth every 6 (six) hours as needed for pain.   Yes Historical Provider, MD   BP 131/89  Pulse 105  Temp(Src) 97.9 F (36.6 C) (Oral)  Resp 18  Ht 5\' 11"  (1.803 m)  Wt 185 lb (83.915 kg)  BMI 25.81 kg/m2  SpO2 97% Physical Exam  Nursing note and vitals reviewed. Constitutional: He is oriented to person, place, and time. He appears well-developed and well-nourished. No distress.  HENT:  Head: Normocephalic and atraumatic.  Right Ear: Tympanic membrane and ear canal normal.  Left Ear: Tympanic membrane and ear canal normal.  Mouth/Throat: Uvula is midline, oropharynx is clear and moist and mucous membranes are normal. No trismus in the jaw. Dental caries present. No dental abscesses or uvula swelling.    Impacted right lower third molar.   No facial swelling, obvious dental abscess, trismus, or sublingual abnml.    Neck: Normal range of motion. Neck supple.  Cardiovascular: Normal rate, regular rhythm and normal heart sounds.   No murmur heard. Pulmonary/Chest: Effort normal and breath sounds normal. No respiratory distress.  Musculoskeletal: Normal range of motion.  Lymphadenopathy:    He has no cervical adenopathy.  Neurological: He is alert and oriented to person, place, and time. He exhibits normal muscle tone. Coordination normal.  Skin: Skin is warm and dry.    ED Course  Procedures (including critical care time) Labs Review Labs Reviewed - No data to display  Imaging Review No results found.   EKG Interpretation None      MDM   Final diagnoses:  Impacted third molar tooth    Pt is well appearing.  No obvious  periapical abscess.  No concerning sx's for infection to deep structures of the neck or floor of the mouth. Airway patent. Pt agrees to amoxil and vicodin.  Given referral list for local dentist.  VSS.  He agrees to plan for f/u and appears stable for d/c    Tammy L. Trisha Mangleriplett, PA-C 06/13/14 1247

## 2014-06-12 NOTE — ED Notes (Signed)
Patient states that his wisdom teeth are coming in and his right lower back tooth has been hurting x 2 days

## 2014-06-15 NOTE — ED Provider Notes (Signed)
Medical screening examination/treatment/procedure(s) were performed by non-physician practitioner and as supervising physician I was immediately available for consultation/collaboration.   EKG Interpretation None        Makale Pindell, MD 06/15/14 1551 

## 2014-07-06 ENCOUNTER — Encounter (HOSPITAL_COMMUNITY): Payer: Self-pay | Admitting: Emergency Medicine

## 2014-07-06 ENCOUNTER — Emergency Department (HOSPITAL_COMMUNITY)
Admission: EM | Admit: 2014-07-06 | Discharge: 2014-07-06 | Disposition: A | Discharge status: Home / Self Care | Payer: Self-pay | Attending: Emergency Medicine | Admitting: Emergency Medicine

## 2014-07-06 DIAGNOSIS — F17200 Nicotine dependence, unspecified, uncomplicated: Secondary | ICD-10-CM | POA: Insufficient documentation

## 2014-07-06 DIAGNOSIS — K089 Disorder of teeth and supporting structures, unspecified: Secondary | ICD-10-CM | POA: Insufficient documentation

## 2014-07-06 DIAGNOSIS — K0889 Other specified disorders of teeth and supporting structures: Secondary | ICD-10-CM

## 2014-07-06 MED ORDER — HYDROCODONE-ACETAMINOPHEN 5-325 MG PO TABS
1.0000 | ORAL_TABLET | Freq: Once | ORAL | Status: AC
  Administered 2014-07-06: 1 via ORAL
  Filled 2014-07-06: qty 1

## 2014-07-06 MED ORDER — HYDROCODONE-ACETAMINOPHEN 5-325 MG PO TABS: 1.0000 | ORAL_TABLET | ORAL | Status: DC | PRN

## 2014-07-06 NOTE — ED Notes (Signed)
Patient complaining of pain in wisdom teeth. States that he does not have the money to have the teeth surgically removed. Was told to return here if he was having problems.

## 2014-07-06 NOTE — Discharge Instructions (Signed)
Dental Pain  Toothache is pain in or around a tooth. It may get worse with chewing or with cold or heat.   HOME CARE  · Your dentist may use a numbing medicine during treatment. If so, you may need to avoid eating until the medicine wears off. Ask your dentist about this.  · Only take medicine as told by your dentist or doctor.  · Avoid chewing food near the painful tooth until after all treatment is done. Ask your dentist about this.  GET HELP RIGHT AWAY IF:   · The problem gets worse or new problems appear.  · You have a fever.  · There is redness and puffiness (swelling) of the face, jaw, or neck.  · You cannot open your mouth.  · There is pain in the jaw.  · There is very bad pain that is not helped by medicine.  MAKE SURE YOU:   · Understand these instructions.  · Will watch your condition.  · Will get help right away if you are not doing well or get worse.  Document Released: 05/26/2008 Document Revised: 03/01/2012 Document Reviewed: 05/26/2008  ExitCare® Patient Information ©2015 [employer]. This information is not intended to replace advice given to you by your health care provider. Make sure you discuss any questions you have with your health care provider.

## 2014-07-08 NOTE — ED Provider Notes (Signed)
CSN: [id number]     Arrival date & time 07/06/14  2032 History   First MD Initiated Contact with Patient 07/06/14 2047     Chief Complaint  Patient presents with  . Dental Pain     (Consider location/radiation/quality/duration/timing/severity/associated sxs/prior Treatment) The history is provided by the patient.   Troy Clayton is a 23 y.o. male presenting with a several history of dental pain and gingival swelling, although the swelling is improved since completing a course of amoxil given his last visit here for this on 6/23. He has complaint of his wisdom teeth pushing against his other molars causing pain in his lower teeth, right greater than left. There has been no fevers, chills, nausea or vomiting, also no complaint of difficulty swallowing, although chewing makes pain worse.  The patient has tried tylenol and ibuprofen without relief of symptoms.  He does not have a dentist.       History reviewed. No pertinent past medical history. Past Surgical History  Procedure Laterality Date  . Finger surgery    . Finger surgery     History reviewed. No pertinent family history. History  Substance Use Topics  . Smoking status: Current Every Day Smoker -- 1.00 packs/day for 3 years    Types: Cigarettes  . Smokeless tobacco: Never Used  . Alcohol Use: No    Review of Systems  Constitutional: Negative for fever.  HENT: Positive for dental problem. Negative for facial swelling and sore throat.   Respiratory: Negative for shortness of breath.   Musculoskeletal: Negative for neck pain and neck stiffness.      Allergies  Review of patient's allergies indicates no known allergies.  Home Medications   Prior to Admission medications   Medication Sig Start Date End Date Taking? Authorizing Provider  acetaminophen (TYLENOL) 500 MG tablet Take 1,000 mg by mouth every 6 (six) hours as needed for pain.   Yes Historical Provider, MD  amoxicillin (AMOXIL) 500 MG capsule Take 1  capsule (500 mg total) by mouth 3 (three) times daily. For 10 days 06/12/14  Yes Tammy L. Triplett, PA-C  amoxicillin (AMOXIL) 500 MG capsule Take 500 mg by mouth daily as needed (for dental pain).   Yes Historical Provider, MD  ibuprofen (ADVIL,MOTRIN) 200 MG tablet Take 200 mg by mouth every 6 (six) hours as needed for mild pain.   Yes Historical Provider, MD  HYDROcodone-acetaminophen (NORCO/VICODIN) 5-325 MG per tablet Take 1 tablet by mouth every 4 (four) hours as needed. 07/06/14   Burgess Amor, PA-C   BP 118/74  Pulse 109  Temp(Src) 98.8 F (37.1 C) (Oral)  Resp 18  Ht 5\' 11"  (1.803 m)  Wt 185 lb (83.915 kg)  BMI 25.81 kg/m2  SpO2 98% Physical Exam  Constitutional: He is oriented to person, place, and time. He appears well-developed and well-nourished. No distress.  HENT:  Head: Normocephalic and atraumatic.  Right Ear: Tympanic membrane and external ear normal.  Left Ear: Tympanic membrane and external ear normal.  Mouth/Throat: Oropharynx is clear and moist and mucous membranes are normal. No oral lesions. No trismus in the jaw. Abnormal dentition.    Eyes: Conjunctivae are normal.  Neck: Normal range of motion. Neck supple.  Cardiovascular: Normal rate and normal heart sounds.   Pulmonary/Chest: Effort normal.  Abdominal: He exhibits no distension.  Musculoskeletal: Normal range of motion.  Lymphadenopathy:    He has no cervical adenopathy.  Neurological: He is alert and oriented to person, place, and time.  Skin:  Skin is warm and dry. No erythema.  Psychiatric: He has a normal mood and affect.    ED Course  Procedures (including critical care time) Labs Review Labs Reviewed - No data to display  Imaging Review No results found.   EKG Interpretation None      MDM   Final diagnoses:  Pain, dental    Dental referrals given including Dr. Russella DarBenitez who is on call for [facility] today.  Pt advised he should call within 48 hours if he desires to [place]   Advised he will probably need an oral surgeon, but would defer to Dr. Russella DarBenitez expertise.  Pt agrees and understands.  He was prescribed hydrocodone.  No indicatation for additional abx today.    Burgess AmorJulie Erica Richwine, PA-C 07/08/14 1340

## 2014-07-13 NOTE — ED Provider Notes (Signed)
Medical screening examination/treatment/procedure(s) were performed by non-physician practitioner and as supervising physician I was immediately available for consultation/collaboration.   EKG Interpretation None       Alazar Cherian, MD 07/13/14 1556 

## 2014-08-26 IMAGING — CR DG HAND COMPLETE 3+V*L*
3 series · 3 of 3 positions shown · non-contrast
Comparison: None

CLINICAL DATA: Fell skateboarding, LEFT hand pain

EXAM:
LEFT HAND - COMPLETE 3+ VIEW

[view not recorded (1 of 3)]
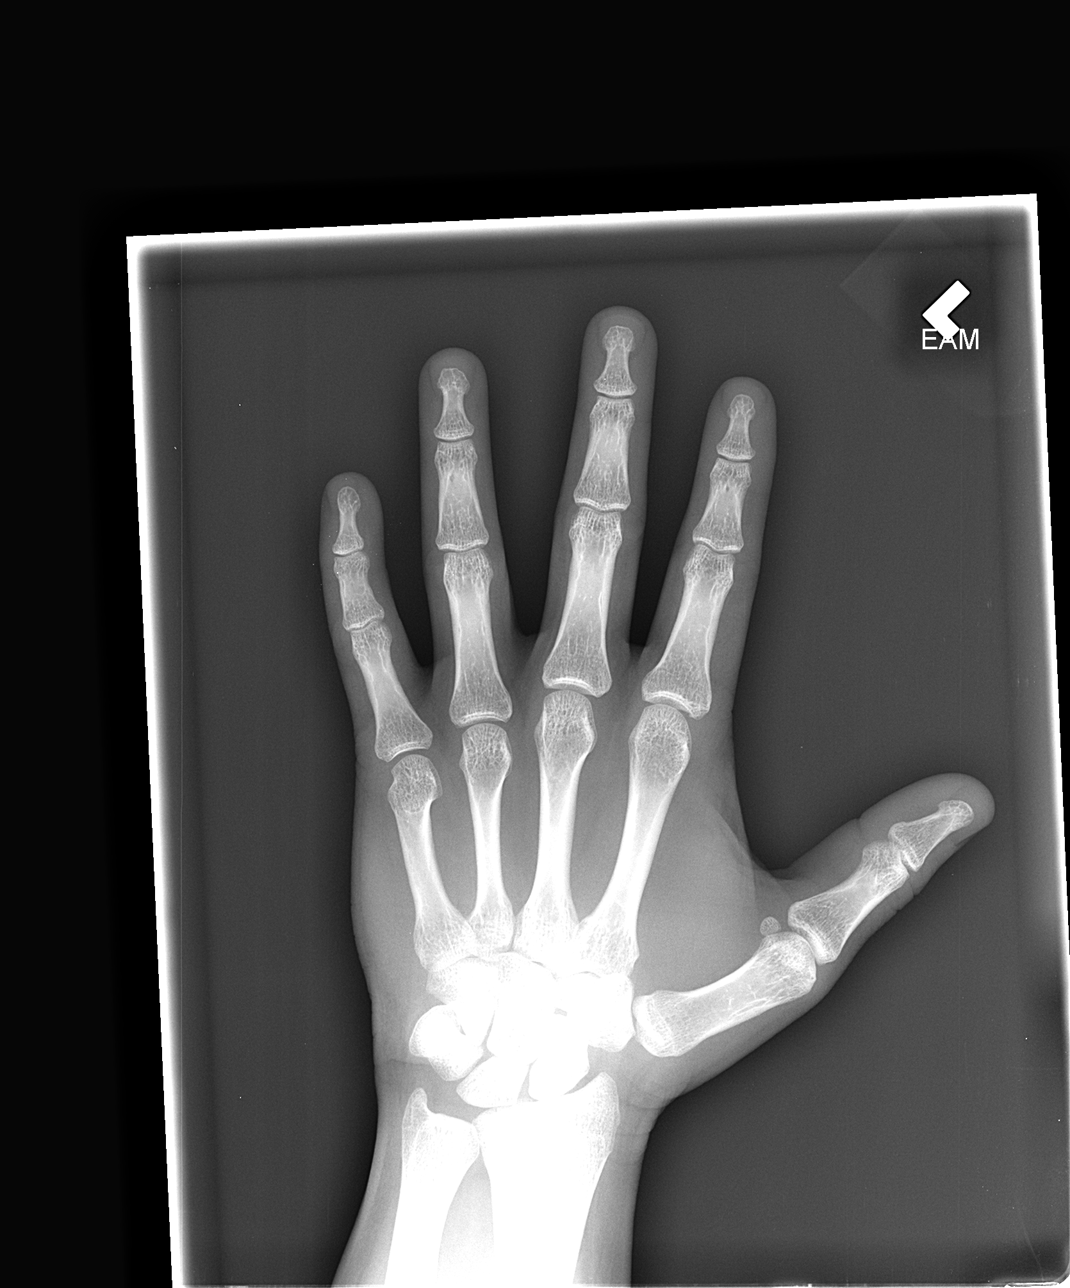

[view not recorded (2 of 3)]
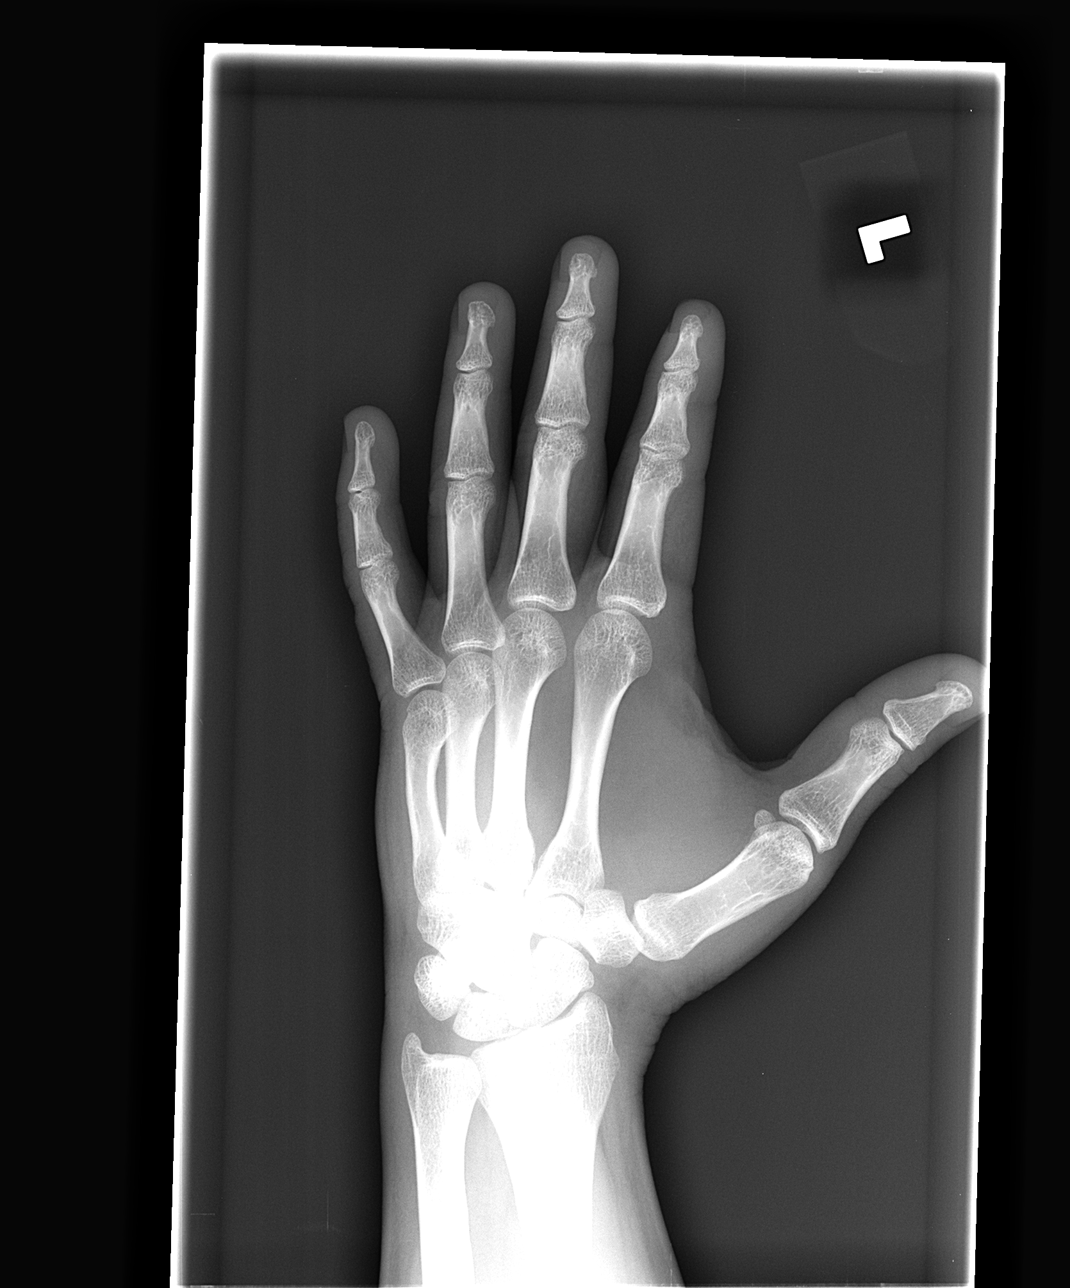

[view not recorded (3 of 3)]
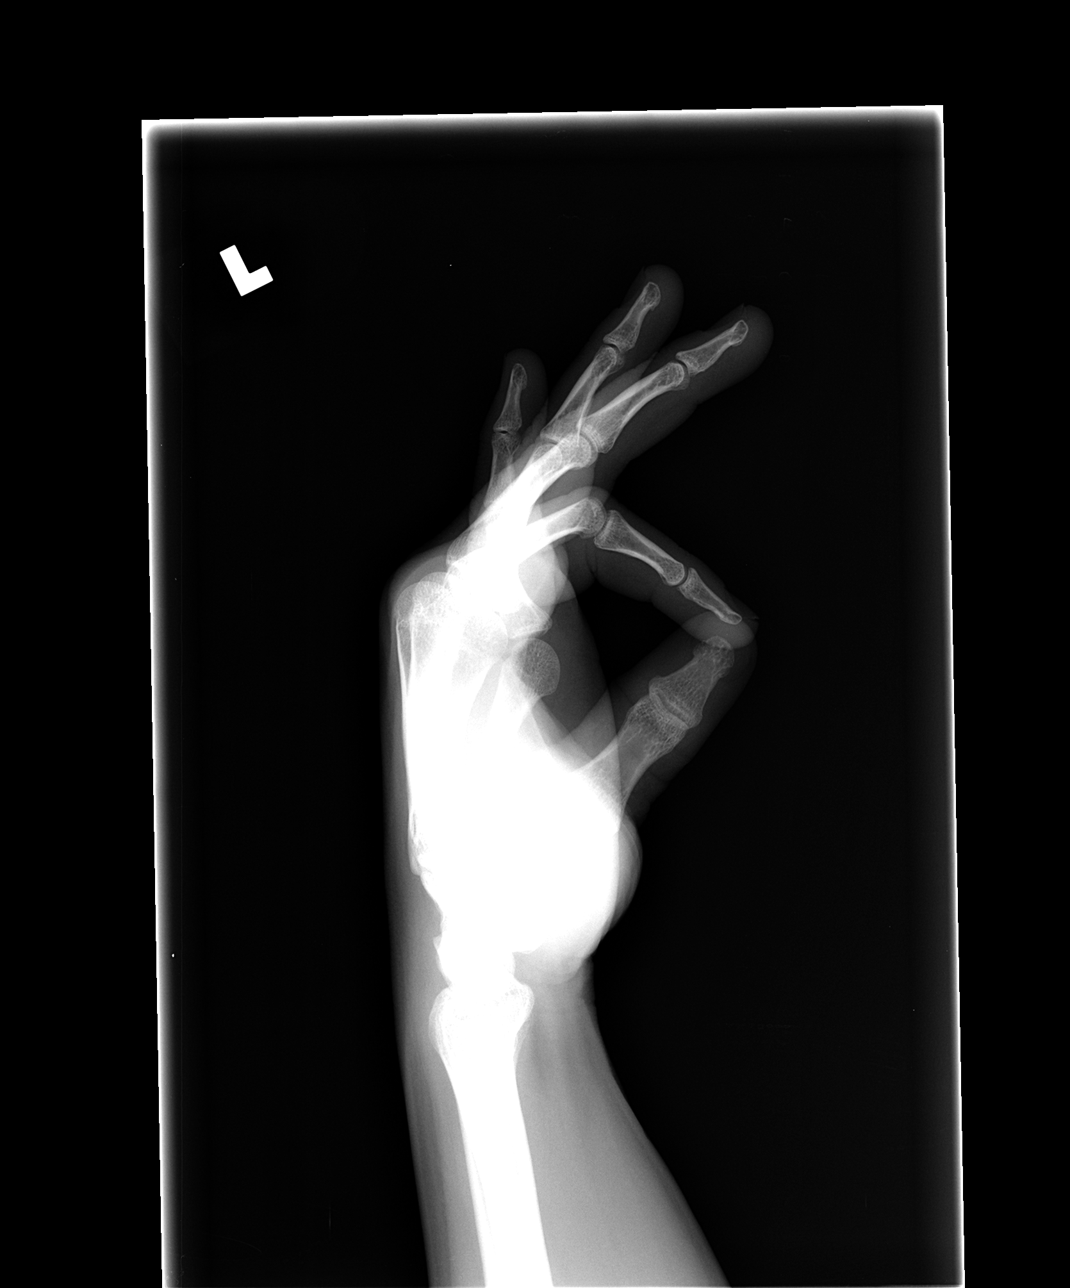

[3 of 3 positions shown; findings below may reference images not displayed]

FINDINGS: Osseous mineralization normal.

Joint spaces preserved.

Probable nondisplaced metaphyseal fracture base of proximal phalanx
LEFT index finger with intra-articular extension at second MCP
joint.

No additional fracture, dislocation, or bone destruction.
IMPRESSION: Probable nondisplaced metaphyseal fracture at base of proximal
phalanx LEFT index finger with intra-articular extension at second
MCP joint ; recommend clinic correlation for pain/tenderness at this
site.

## 2014-08-26 IMAGING — CR DG RIBS W/ CHEST 3+V*L*
4 series · 4 of 4 positions shown · non-contrast
Comparison: Chest radiograph 04/08/2014

CLINICAL DATA: Fell skateboarding, LEFT side rib pain

EXAM:
LEFT RIBS AND CHEST - 3+ VIEW

[view not recorded (1 of 4)]
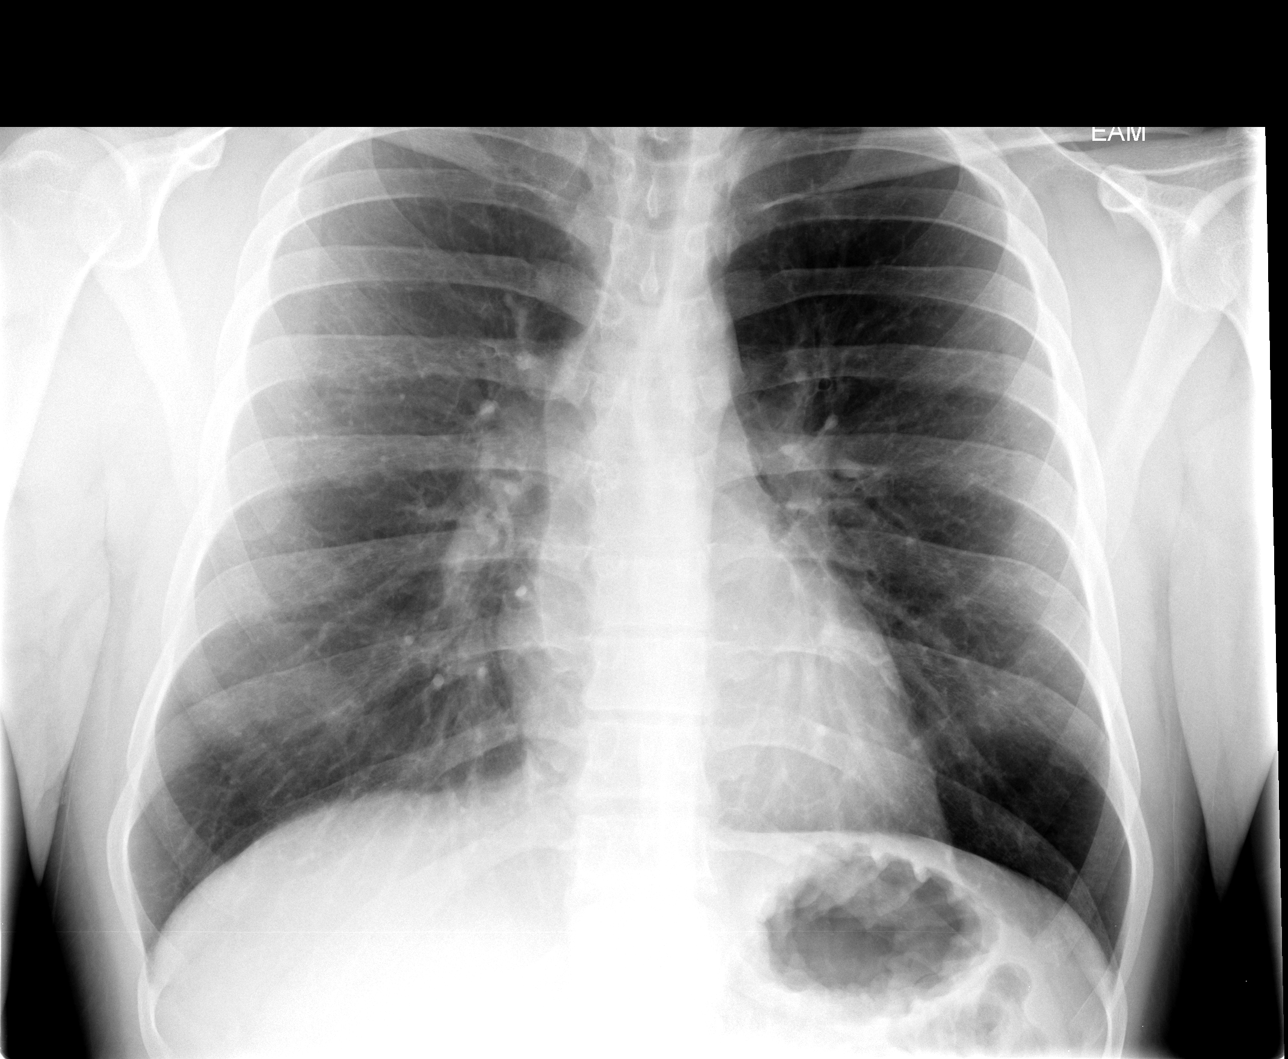

[view not recorded (2 of 4)]
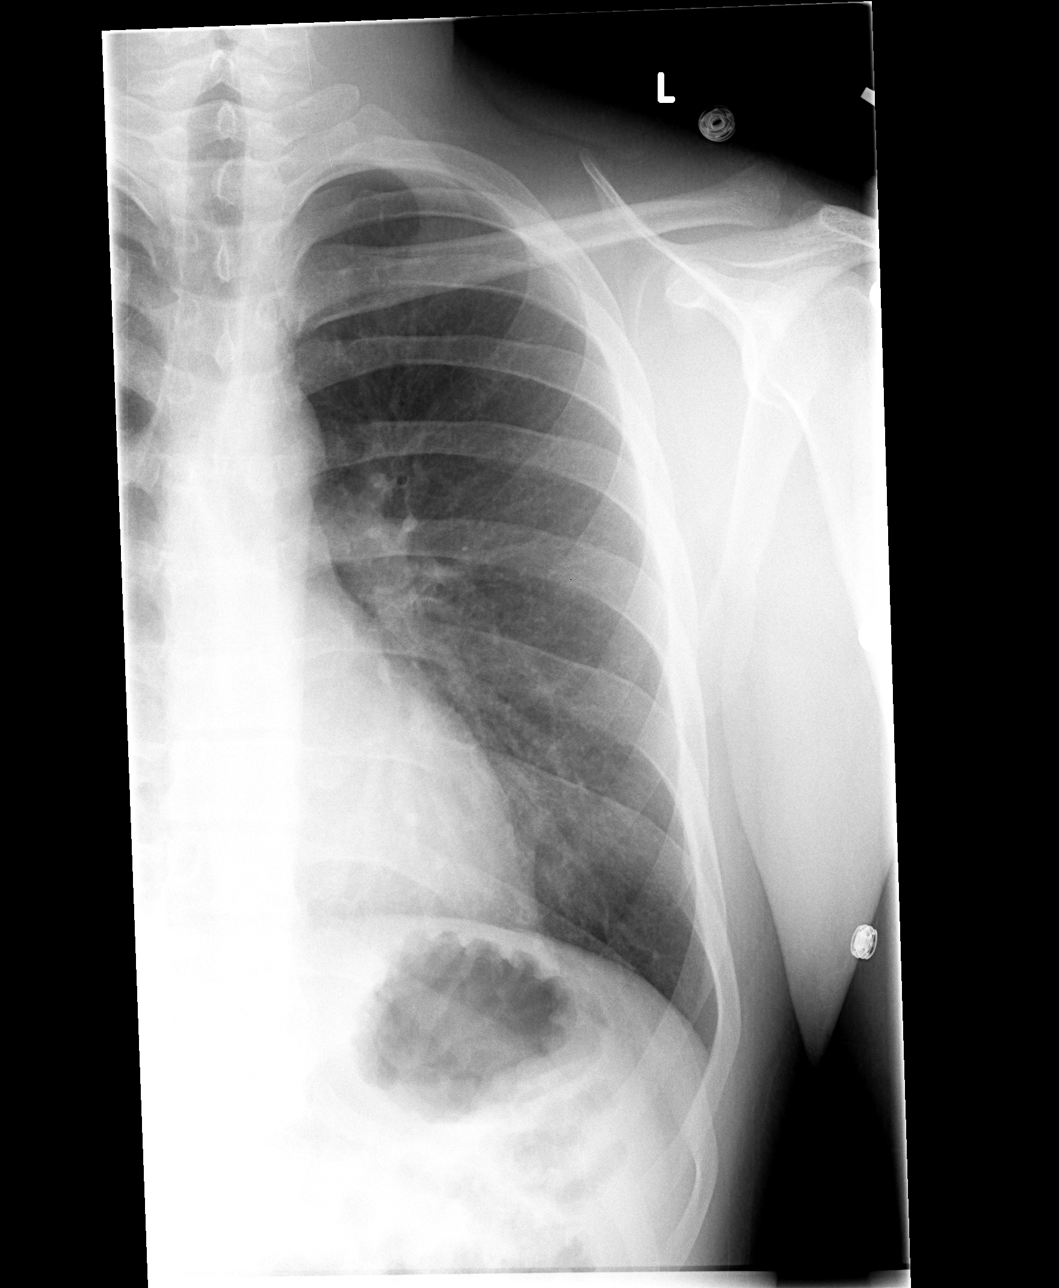

[view not recorded (3 of 4)]
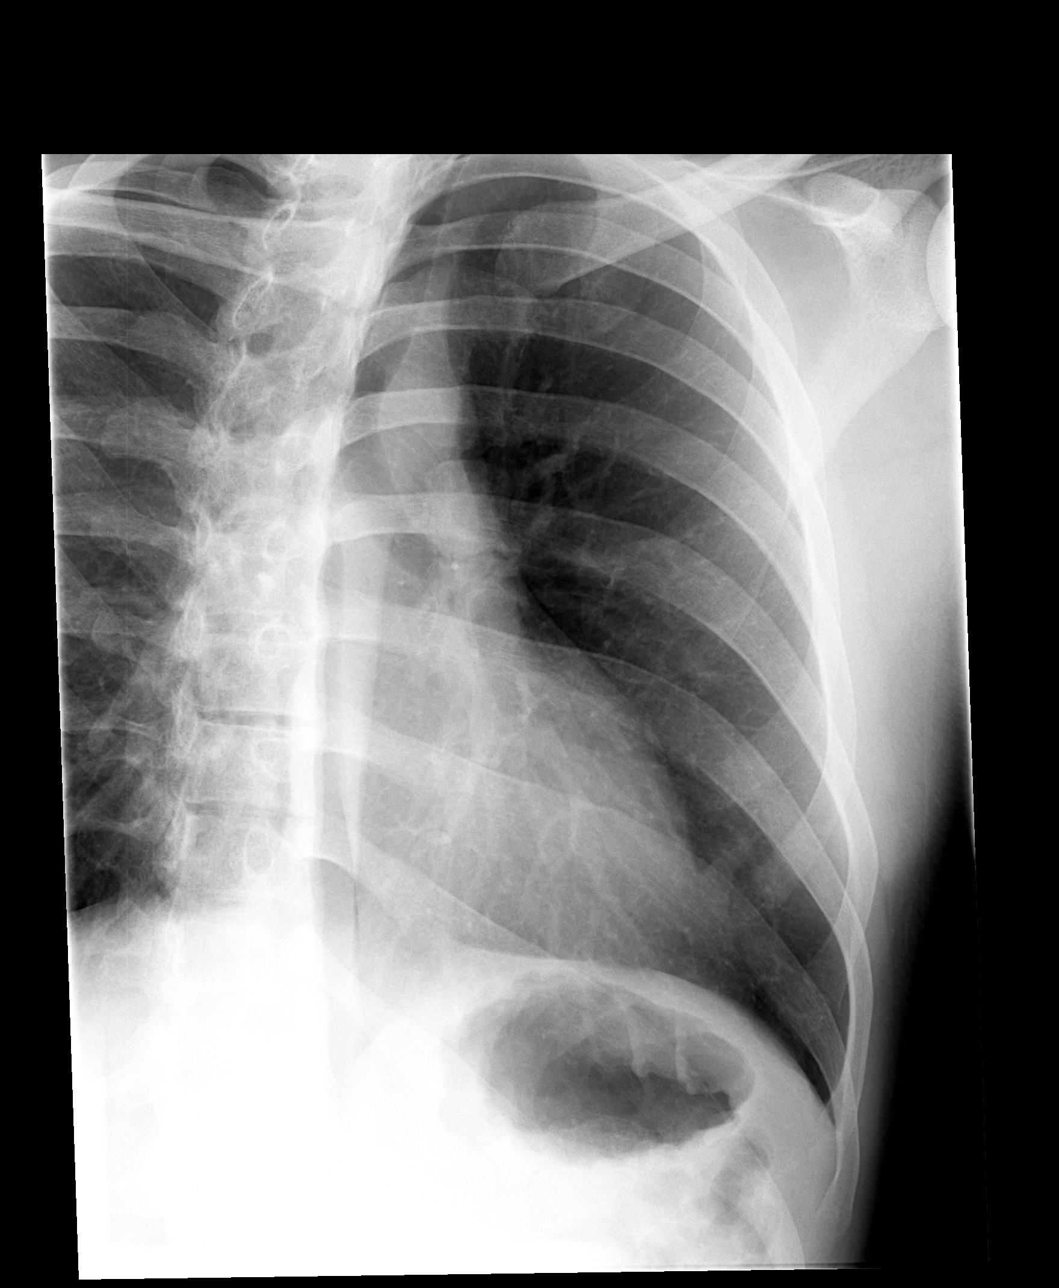

[view not recorded (4 of 4)]
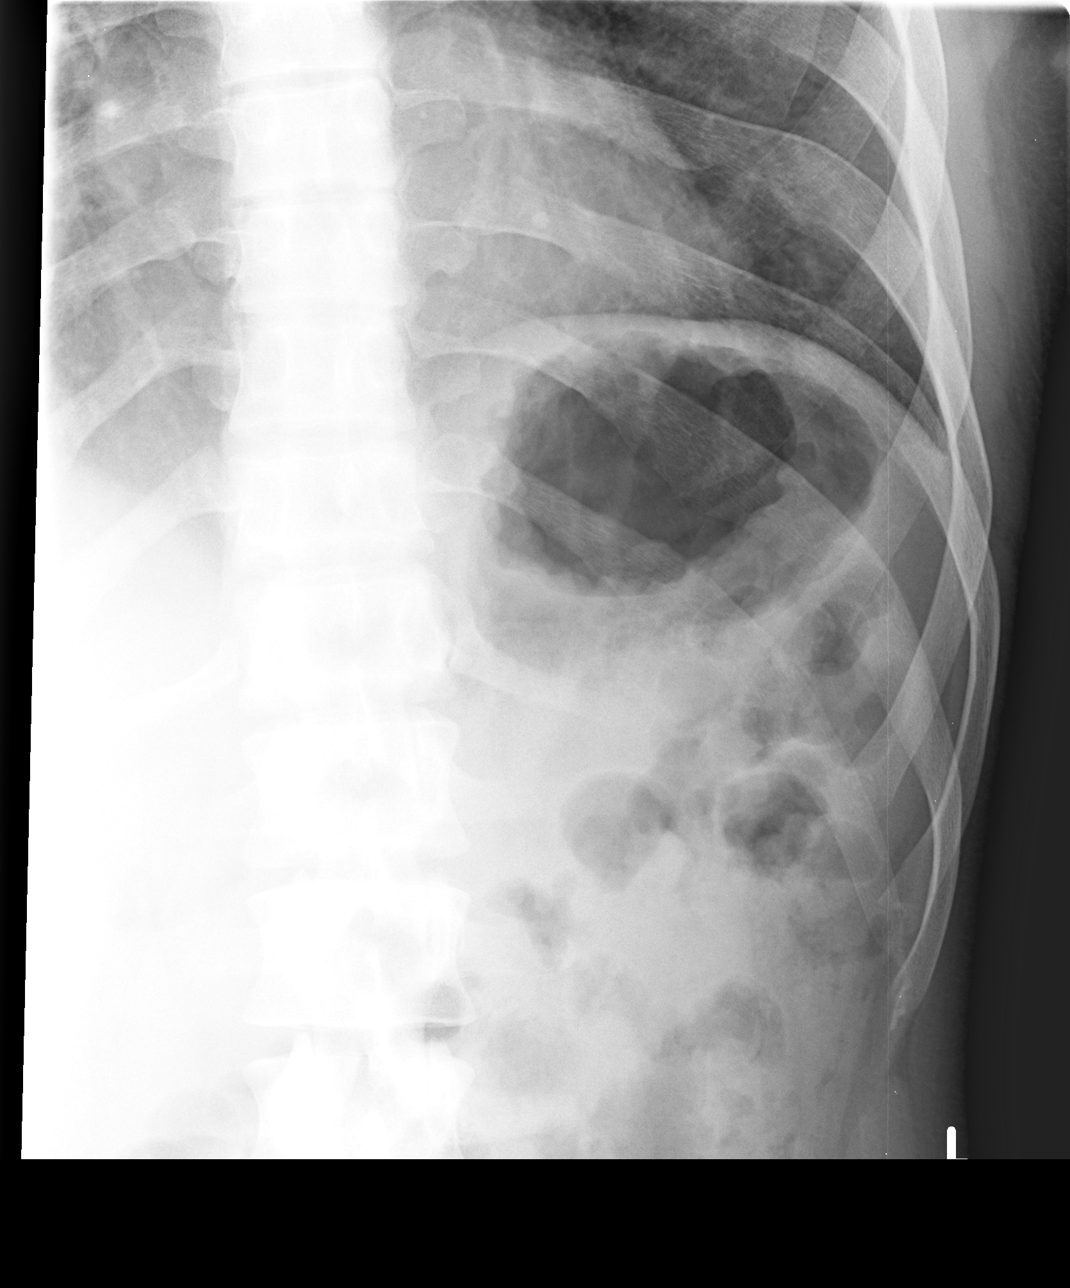

[4 of 4 positions shown; findings below may reference images not displayed]

FINDINGS: Normal heart size, mediastinal contours, and pulmonary vascularity.

Minimal peribronchial thickening, chronic.

Lungs clear.

No pleural effusion or pneumothorax.

Osseous mineralization normal.

No definite rib fracture or bone destruction.
IMPRESSION: Minimal chronic bronchitic changes.

No definite acute LEFT rib abnormalities.

## 2014-08-26 IMAGING — CR DG KNEE COMPLETE 4+V*L*
4 series · 4 of 4 positions shown · non-contrast
Comparison: None

CLINICAL DATA: Fell skateboarding, LEFT knee pain

EXAM:
LEFT KNEE - COMPLETE 4+ VIEW

[view not recorded (1 of 4)]
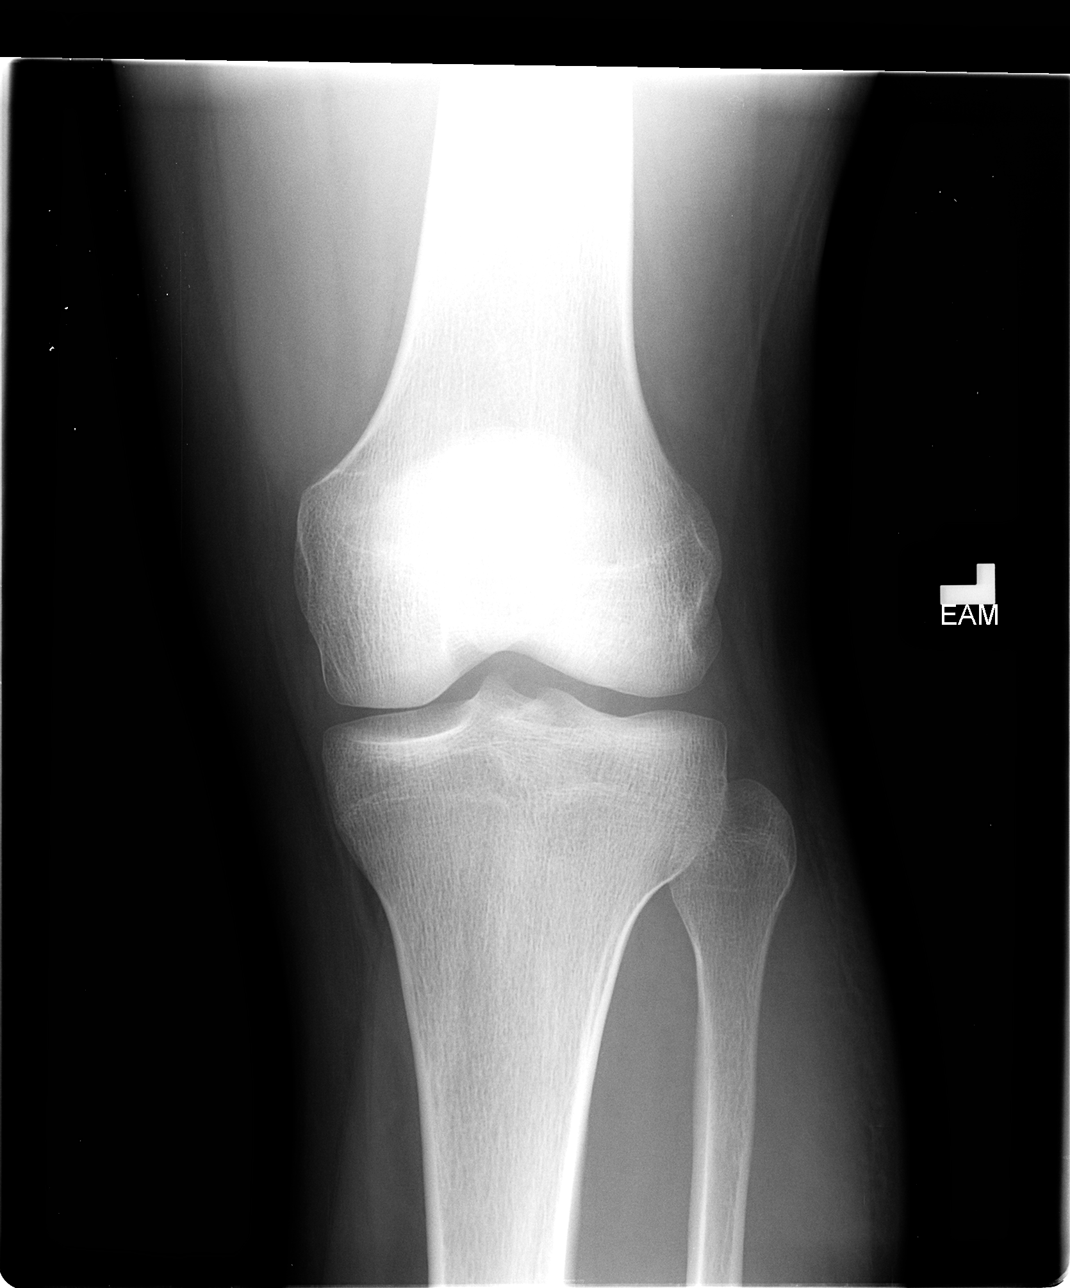

[view not recorded (2 of 4)]
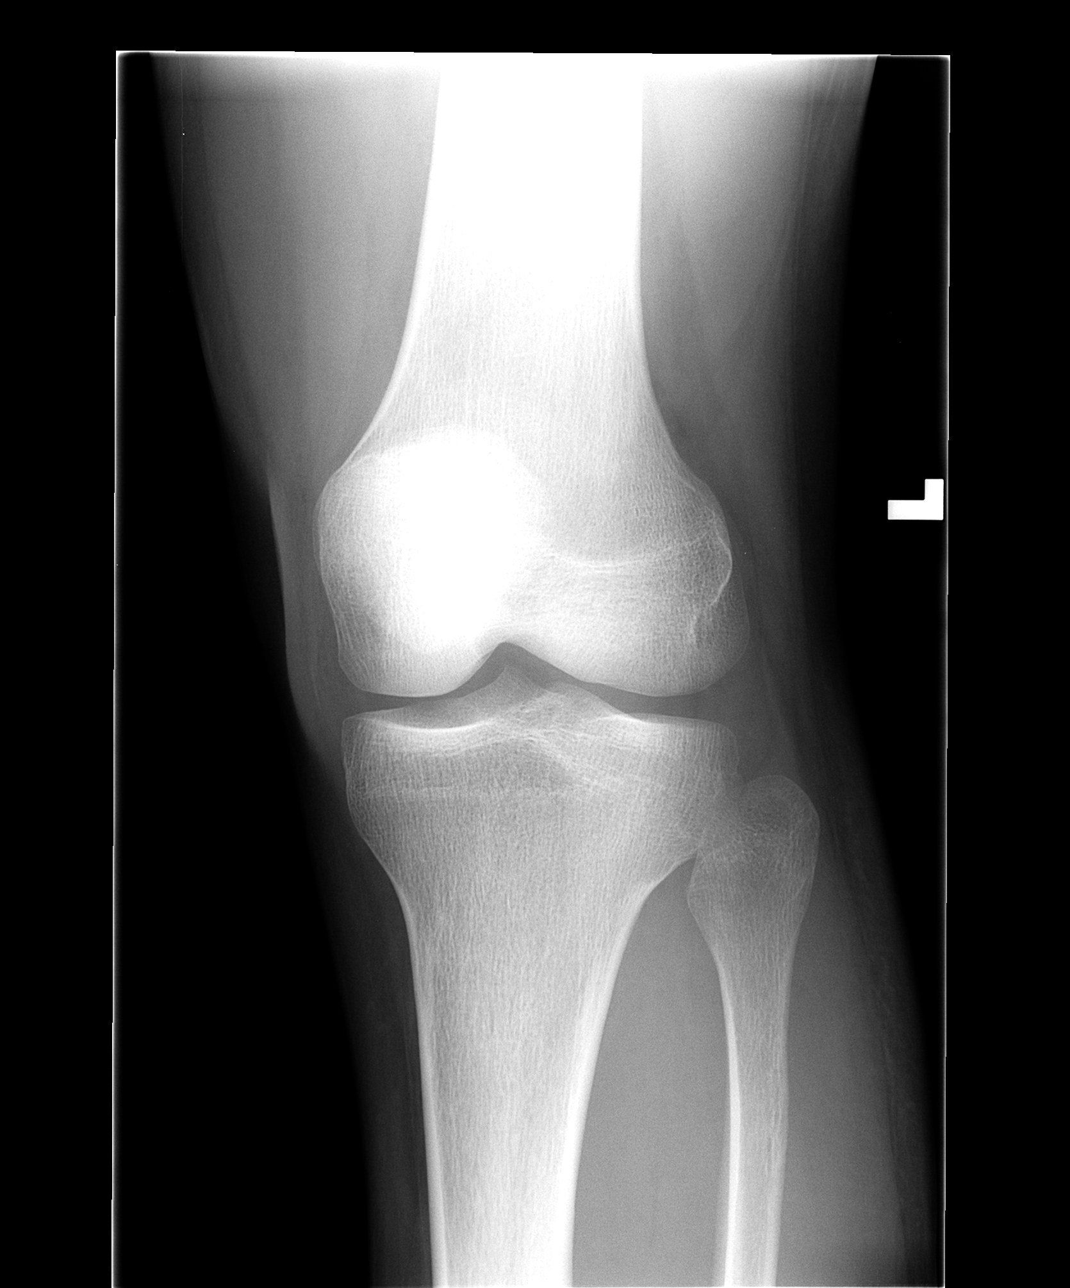

[view not recorded (3 of 4)]
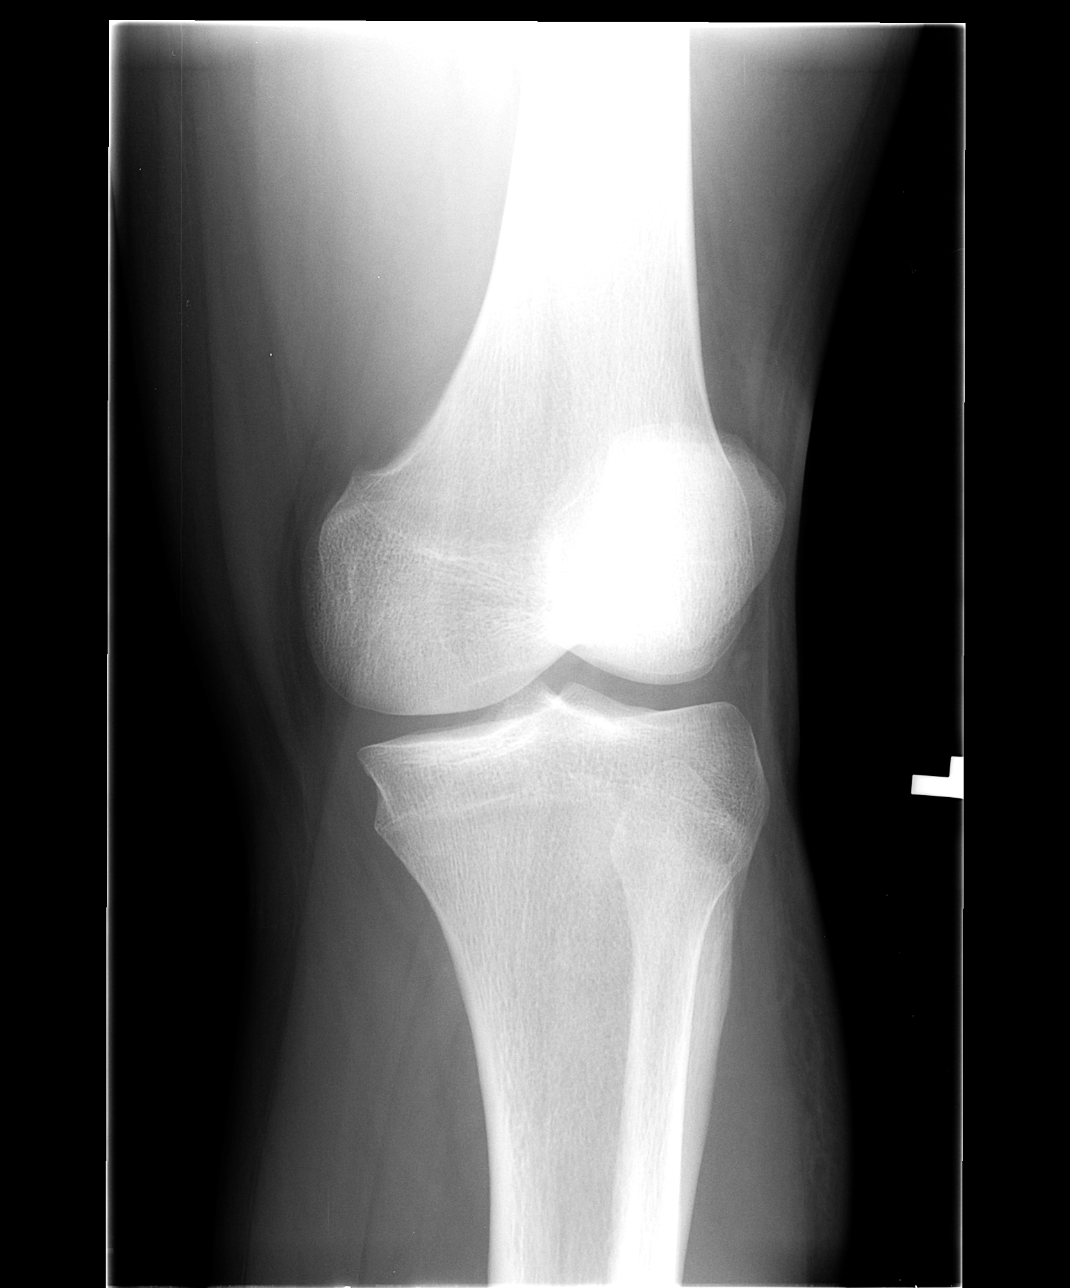

[view not recorded (4 of 4)]
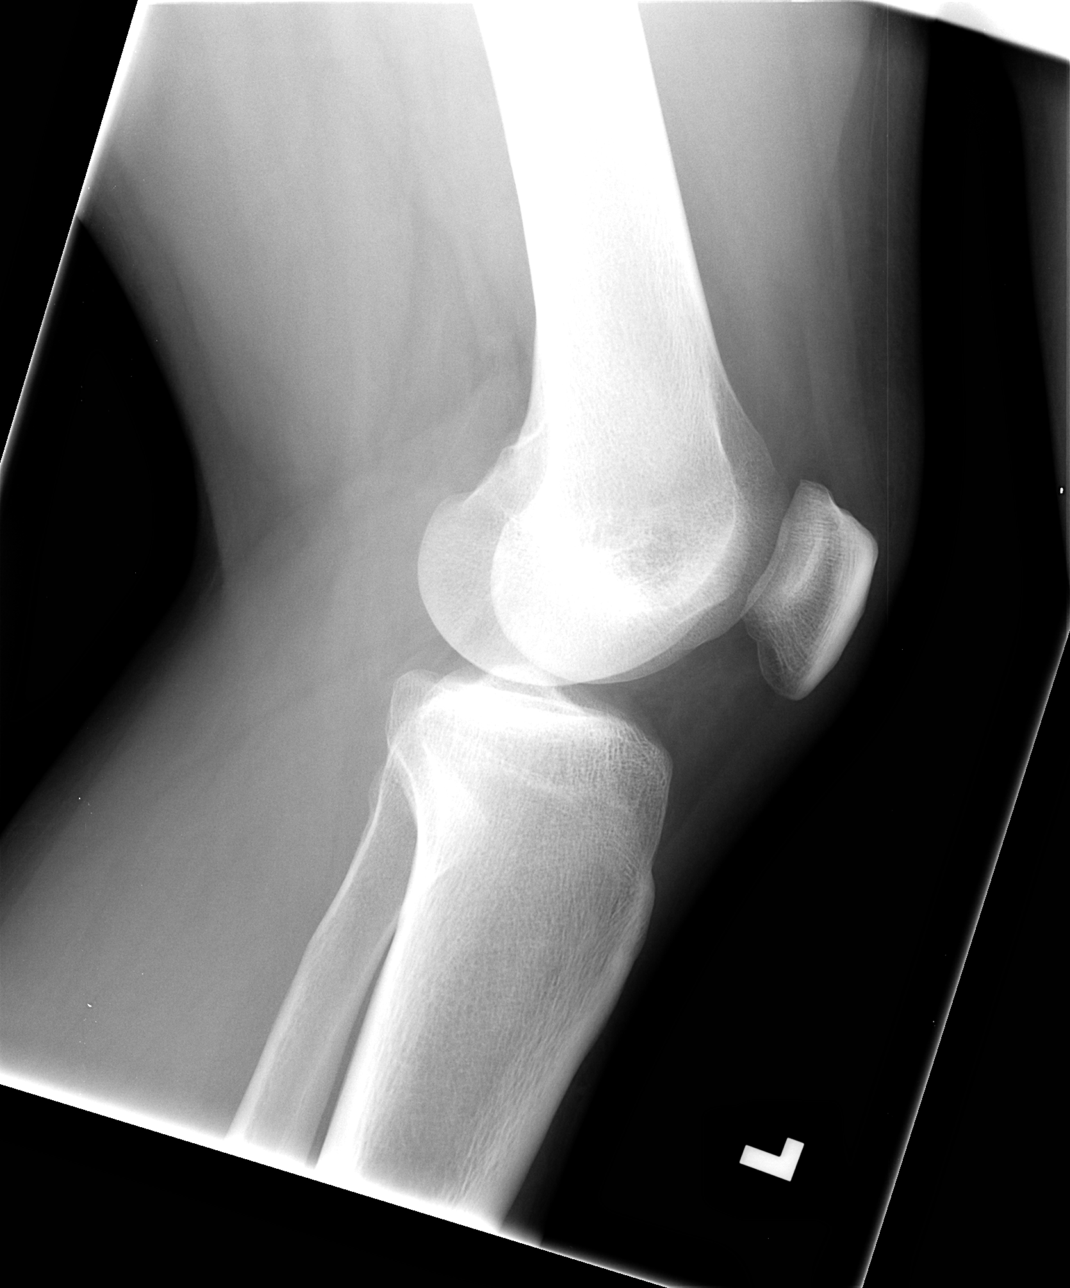

[4 of 4 positions shown; findings below may reference images not displayed]

FINDINGS: Bone mineralization normal.

Joint spaces preserved.

No fracture, dislocation, or bone destruction.

No joint effusion.
IMPRESSION: No acute osseous abnormalities.

## 2015-02-14 ENCOUNTER — Encounter (HOSPITAL_COMMUNITY): Payer: Self-pay | Admitting: Emergency Medicine

## 2015-02-14 ENCOUNTER — Emergency Department (HOSPITAL_COMMUNITY)
Admission: EM | Admit: 2015-02-14 | Discharge: 2015-02-14 | Disposition: A | Discharge status: Home / Self Care | Payer: Self-pay | Attending: Emergency Medicine | Admitting: Emergency Medicine

## 2015-02-14 DIAGNOSIS — J039 Acute tonsillitis, unspecified: Secondary | ICD-10-CM | POA: Insufficient documentation

## 2015-02-14 DIAGNOSIS — Z720 Tobacco use: Secondary | ICD-10-CM | POA: Insufficient documentation

## 2015-02-14 DIAGNOSIS — R1110 Vomiting, unspecified: Secondary | ICD-10-CM | POA: Insufficient documentation

## 2015-02-14 LAB — RAPID STREP SCREEN (MED CTR MEBANE ONLY): Streptococcus, Group A Screen (Direct): POSITIVE — AB

## 2015-02-14 MED ORDER — IBUPROFEN 400 MG PO TABS
400.0000 mg | ORAL_TABLET | Freq: Once | ORAL | Status: AC
  Administered 2015-02-14: 400 mg via ORAL
  Filled 2015-02-14: qty 1

## 2015-02-14 MED ORDER — HYDROCODONE-ACETAMINOPHEN 7.5-325 MG/15ML PO SOLN
10.0000 mL | Freq: Once | ORAL | Status: AC
  Administered 2015-02-14: 10 mL via ORAL
  Filled 2015-02-14: qty 15

## 2015-02-14 MED ORDER — AMOXICILLIN 500 MG PO CAPS: 1000.0000 mg | ORAL_CAPSULE | Freq: Once | ORAL | Status: DC

## 2015-02-14 MED ORDER — HYDROCODONE-ACETAMINOPHEN 5-325 MG PO TABS: ORAL_TABLET | ORAL | Status: DC

## 2015-02-14 NOTE — ED Provider Notes (Signed)
CSN: [id number]     Arrival date & time 02/14/15  1429 History   First MD Initiated Contact with Patient 02/14/15 1440     Chief Complaint  Patient presents with  . Sore Throat     (Consider location/radiation/quality/duration/timing/severity/associated sxs/prior Treatment) HPI  Troy Clayton is a 24 y.o. male who presents to the Emergency Department complaining of gradually worsening sore throat for several days. He states that it is difficult to swallow his own saliva. He has been drinking Gatorade and states that the only thing that he seems to be able to tolerate. He states the pain became intense yesterday. He also complains of intermittent body aches and fever. His been taking Tylenol and ibuprofen with relief of the fever. He also reports several episodes of vomiting yesterday but states that it has since resolved.  He denies abdominal pain, neck pain, shortness of breath and nasal congestion or cough.   History reviewed. No pertinent past medical history. Past Surgical History  Procedure Laterality Date  . Finger surgery    . Finger surgery     History reviewed. No pertinent family history. History  Substance Use Topics  . Smoking status: Current Every Day Smoker -- 0.50 packs/day for 3 years    Types: Cigarettes  . Smokeless tobacco: Never Used  . Alcohol Use: No    Review of Systems  Constitutional: Positive for fever and chills. Negative for activity change and appetite change.  HENT: Positive for sore throat and trouble swallowing. Negative for congestion, ear pain, facial swelling and voice change.   Eyes: Negative for pain and visual disturbance.  Respiratory: Negative for cough and shortness of breath.   Gastrointestinal: Positive for vomiting. Negative for nausea and abdominal pain.  Musculoskeletal: Negative for arthralgias, neck pain and neck stiffness.  Skin: Negative for color change and rash.  Neurological: Negative for dizziness, facial asymmetry,  speech difficulty, numbness and headaches.  Hematological: Negative for adenopathy.  All other systems reviewed and are negative.     Allergies  Review of patient's allergies indicates no known allergies.  Home Medications   Prior to Admission medications   Medication Sig Start Date End Date Taking? Authorizing Provider  acetaminophen (TYLENOL) 500 MG tablet Take 1,000 mg by mouth every 6 (six) hours as needed for pain.   Yes Historical Provider, MD  amoxicillin (AMOXIL) 500 MG capsule Take 1 capsule (500 mg total) by mouth 3 (three) times daily. For 10 days Patient not taking: Reported on 02/14/2015 06/12/14   Shaiden Aldous L. Errin Chewning, PA-C  HYDROcodone-acetaminophen (NORCO/VICODIN) 5-325 MG per tablet Take 1 tablet by mouth every 4 (four) hours as needed. Patient not taking: Reported on 02/14/2015 07/06/14   Burgess AmorJulie Idol, PA-C  ibuprofen (ADVIL,MOTRIN) 200 MG tablet Take 200 mg by mouth every 6 (six) hours as needed for mild pain.    Historical Provider, MD   BP 137/68 mmHg  Pulse 108  Temp(Src) 99.8 F (37.7 C) (Oral)  Resp 18  Ht 5\' 10"  (1.778 m)  Wt 200 lb (90.719 kg)  BMI 28.70 kg/m2  SpO2 98% Physical Exam  Constitutional: He is oriented to person, place, and time. He appears well-developed and well-nourished. No distress.  HENT:  Head: Normocephalic and atraumatic.  Right Ear: Tympanic membrane and ear canal normal.  Left Ear: Tympanic membrane and ear canal normal.  Mouth/Throat: Uvula is midline and mucous membranes are normal. No trismus in the jaw. No uvula swelling. Posterior oropharyngeal edema and posterior oropharyngeal erythema present. No oropharyngeal  exudate or tonsillar abscesses.  Moderate erythema and edema of the bilateral tonsils. Oropharynx is erythematous as well. No exudates, no peritonsillar abscess. Uvula is midline  Neck: Normal range of motion. Neck supple.  Cardiovascular: Normal rate, regular rhythm and normal heart sounds.   No murmur  heard. Pulmonary/Chest: Effort normal and breath sounds normal. No respiratory distress.  Abdominal: Soft. He exhibits no distension. There is no splenomegaly. There is no tenderness. There is no rebound and no guarding.  Musculoskeletal: Normal range of motion.  Lymphadenopathy:    He has no cervical adenopathy.  Neurological: He is alert and oriented to person, place, and time. He exhibits normal muscle tone. Coordination normal.  Skin: Skin is warm and dry.  Nursing note and vitals reviewed.   ED Course  Procedures (including critical care time) Labs Review Labs Reviewed  RAPID STREP SCREEN    Imaging Review No results found.   EKG Interpretation None      MDM   Final diagnoses:  Tonsillitis    Patient is well appearing, non-toxic.  Airway patent. Handles secretions well. No vomiting during ED stay.   Mucous membranes are moist.  No evidence of PTA.  Patient is feeling better after medications.  Agrees to fluids, ibuprofen for fever, and close f/u with his PMD.     Troy Shealy L. Trisha Mangle, PA-C 02/14/15 1636  Samuel Jester, DO 02/16/15 1559

## 2015-02-14 NOTE — ED Notes (Signed)
Pt states that he has had a bad sore throat since yesterday with a cough.

## 2015-02-14 NOTE — Discharge Instructions (Signed)
Tonsillitis °Tonsillitis is an infection of the throat. This infection causes the tonsils to become red, tender, and puffy (swollen). Tonsils are groups of tissue at the back of your throat. If bacteria caused your infection, antibiotic medicine will be given to you. Sometimes symptoms of tonsillitis can be relieved with the use of steroid medicine. If your tonsillitis is severe and happens often, you may need to get your tonsils removed (tonsillectomy). °HOME CARE  °· Rest and sleep often. °· Drink enough fluids to keep your pee (urine) clear or pale yellow. °· While your throat is sore, eat soft or liquid foods like: °¨ Soup. °¨ Ice cream. °¨ Instant breakfast drinks. °· Eat frozen ice pops. °· Gargle with a warm or cold liquid to help soothe the throat. Gargle with a water and salt mix. Mix 1/4 teaspoon of salt and 1/4 teaspoon of baking soda in 1 cup of water. °· Only take medicines as told by your doctor. °· If you are given medicines (antibiotics), take them as told. Finish them even if you start to feel better. °GET HELP IF: °· You have large, tender lumps in your neck. °· You have a rash. °· You cough up green, yellow-brown, or bloody fluid. °· You cannot swallow liquids or food for 24 hours. °· You notice that only one of your tonsils is swollen. °GET HELP RIGHT AWAY IF:  °· You throw up (vomit). °· You have a very bad headache. °· You have a stiff neck. °· You have chest pain. °· You have trouble breathing or swallowing. °· You have bad throat pain, drooling, or your voice changes. °· You have bad pain not helped by medicine. °· You cannot fully open your mouth. °· You have redness, puffiness, or bad pain in the neck. °· You have a fever. °MAKE SURE YOU:  °· Understand these instructions. °· Will watch your condition. °· Will get help right away if you are not doing well or get worse. °Document Released: 05/26/2008 Document Revised: 12/13/2013 Document Reviewed: 05/27/2013 °[employer]® Patient Information  ©2015 [employer]. This information is not intended to replace advice given to you by your health care provider. Make sure you discuss any questions you have with your health care provider. ° °

## 2017-02-05 ENCOUNTER — Emergency Department (HOSPITAL_COMMUNITY)
Admission: EM | Admit: 2017-02-05 | Discharge: 2017-02-05 | Disposition: A | Discharge status: Home / Self Care | Payer: Self-pay | Attending: Emergency Medicine | Admitting: Emergency Medicine

## 2017-02-05 ENCOUNTER — Encounter (HOSPITAL_COMMUNITY): Payer: Self-pay

## 2017-02-05 ENCOUNTER — Encounter (HOSPITAL_COMMUNITY): Payer: Self-pay | Admitting: Emergency Medicine

## 2017-02-05 DIAGNOSIS — F17210 Nicotine dependence, cigarettes, uncomplicated: Secondary | ICD-10-CM | POA: Insufficient documentation

## 2017-02-05 DIAGNOSIS — K029 Dental caries, unspecified: Secondary | ICD-10-CM | POA: Insufficient documentation

## 2017-02-05 DIAGNOSIS — K047 Periapical abscess without sinus: Secondary | ICD-10-CM | POA: Insufficient documentation

## 2017-02-05 DIAGNOSIS — K0889 Other specified disorders of teeth and supporting structures: Secondary | ICD-10-CM

## 2017-02-05 DIAGNOSIS — Z79899 Other long term (current) drug therapy: Secondary | ICD-10-CM | POA: Insufficient documentation

## 2017-02-05 MED ORDER — OXYCODONE-ACETAMINOPHEN 5-325 MG PO TABS: 1.0000 | ORAL_TABLET | Freq: Three times a day (TID) | ORAL | 0 refills | Status: DC | PRN

## 2017-02-05 MED ORDER — TRAMADOL HCL 50 MG PO TABS: 50.0000 mg | ORAL_TABLET | Freq: Four times a day (QID) | ORAL | 0 refills | Status: DC | PRN

## 2017-02-05 MED ORDER — AMOXICILLIN 500 MG PO CAPS: 500.0000 mg | ORAL_CAPSULE | Freq: Three times a day (TID) | ORAL | 0 refills | Status: AC

## 2017-02-05 MED ORDER — OXYCODONE-ACETAMINOPHEN 5-325 MG PO TABS
1.0000 | ORAL_TABLET | Freq: Once | ORAL | Status: AC
  Administered 2017-02-05: 1 via ORAL
  Filled 2017-02-05: qty 1

## 2017-02-05 NOTE — Discharge Instructions (Signed)
Follow-up with a dentist soon.   °

## 2017-02-05 NOTE — ED Triage Notes (Signed)
C/o pain to left upper and lower teeth, rates pain 10/10. Schedule for dental appointment next week.

## 2017-02-05 NOTE — ED Provider Notes (Signed)
[facility]-EMERGENCY DEPT Provider Note   CSN: [id number] Arrival date & time: 02/05/17  1659   By signing my name below, I, Freida Busmaniana Omoyeni, attest that this documentation has been prepared under the direction and in the presence of Derwood KaplanAnkit Theodor Mustin, MD . Electronically Signed: Freida Busmaniana Omoyeni, Scribe. 02/05/2017. 7:09 PM.   History   Chief Complaint Chief Complaint  Patient presents with  . Dental Pain    The history is provided by the patient. No language interpreter was used.     HPI Comments:  Troy Clayton is a 26 y.o. male who presents to the Emergency Department complaining of worsening left sided dental pain x months worse since last night.  He reports pain to the both the lower and upper teeth on the left. He has applied orajel and taken ibuprofen without relief.  His pain is exacerbated by wind and teeth are sensitive to the cold. Pt states he has called a dentist but cannot afford to follow up with a dentist. Pt was seen at [facility] ED this AM for the same pain and was discharge home with Rx for ultram and amoxicillin. He has taken ultram without relief. He states his pain has worsened since that visit.    History reviewed. No pertinent past medical history.  Patient Active Problem List   Diagnosis Date Noted  . Mallet finger 08/17/2013    Past Surgical History:  Procedure Laterality Date  . FINGER SURGERY    . FINGER SURGERY         Home Medications    Prior to Admission medications   Medication Sig Start Date End Date Taking? Authorizing Provider  acetaminophen (TYLENOL) 500 MG tablet Take 1,000 mg by mouth every 6 (six) hours as needed for pain.    Historical Provider, MD  amoxicillin (AMOXIL) 500 MG capsule Take 1 capsule (500 mg total) by mouth 3 (three) times daily. For 10 days 02/05/17   Pauline Ausammy Triplett, PA-C  HYDROcodone-acetaminophen (NORCO/VICODIN) 5-325 MG per tablet Take one-two tabs po q 4-6 hrs prn pain 02/14/15   Tammy Triplett, PA-C  ibuprofen (ADVIL,MOTRIN)  200 MG tablet Take 200 mg by mouth every 6 (six) hours as needed for mild pain.    Historical Provider, MD  oxyCODONE-acetaminophen (PERCOCET/ROXICET) 5-325 MG tablet Take 1 tablet by mouth every 8 (eight) hours as needed for severe pain. 02/05/17   Derwood KaplanAnkit Itzel Mckibbin, MD  traMADol (ULTRAM) 50 MG tablet Take 1 tablet (50 mg total) by mouth every 6 (six) hours as needed. 02/05/17   Tammy Triplett, PA-C    Family History History reviewed. No pertinent family history.  Social History Social History  Substance Use Topics  . Smoking status: Current Every Day Smoker    Packs/day: 0.50    Years: 3.00    Types: Cigarettes  . Smokeless tobacco: Never Used  . Alcohol use No     Allergies   Patient has no known allergies.   Review of Systems Review of Systems  Constitutional: Negative for fever.  HENT: Positive for dental problem.    Physical Exam Updated Vital Signs BP 132/92 (BP Location: Right Arm)   Pulse 72   Temp 98.2 F (36.8 C) (Oral)   Resp 18   Ht 5\' 11"  (1.803 m)   Wt 200 lb (90.7 kg)   SpO2 100%   BMI 27.89 kg/m   Physical Exam  Constitutional: He is oriented to person, place, and time. He appears well-developed and well-nourished. No distress.  HENT:  Head: Normocephalic and  atraumatic.  Tooth # 17 and 18 have decay and appear to be tender to palpation. Pt appears to have tenderness over tooth #16 at the base of mouth.  Gingiva color is normal; no trismus or crepitus.  No gingival fluctuance   Eyes: Conjunctivae are normal.  Cardiovascular: Normal rate.   Pulmonary/Chest: Effort normal.  Abdominal: He exhibits no distension.  Neurological: He is alert and oriented to person, place, and time.  Skin: Skin is warm and dry.  Psychiatric: He has a normal mood and affect.  Nursing note and vitals reviewed.    ED Treatments / Results  DIAGNOSTIC STUDIES:  Oxygen Saturation is 100% on RA, normal by my interpretation.    COORDINATION OF CARE:  7:09 PM Discussed  treatment plan with pt at bedside and pt agreed to plan. Pt instructed to follow up with dentist.  Labs (all labs ordered are listed, but only abnormal results are displayed) Labs Reviewed - No data to display  EKG  EKG Interpretation None       Radiology No results found.  Procedures Procedures (including critical care time)  Medications Ordered in ED Medications  oxyCODONE-acetaminophen (PERCOCET/ROXICET) 5-325 MG per tablet 1 tablet (1 tablet Oral Given 02/05/17 1931)     Initial Impression / Assessment and Plan / ED Course  I have reviewed the triage vital signs and the nursing notes.  Pertinent labs & imaging results that were available during my care of the patient were reviewed by me and considered in my medical decision making (see chart for details).     DDx includes: - Periapical tooth infection - Dental abscess - Gingivitis - Dental trauma - Pulpitis - Nerve root compression  Pt has no trismus, stridor or crepitus over the neck. No signs of deep infection on oral exam.  Several teeth are involved. No facial swelling. Pt is healthy and not toxic.  We will give him percocet x 8. He has been taking motrin with no relief. He has called health dept and has an appt set for Monday. He has no records of any narcotic prescription in the database - and appears reliable.  He already has amoxicillin prescription filled. Strict ER return precautions have been discussed, and patient is agreeing with the plan and is comfortable with the workup done and the recommendations from the ER.   Final Clinical Impressions(s) / ED Diagnoses   Final diagnoses:  Dental abscess    New Prescriptions New Prescriptions   OXYCODONE-ACETAMINOPHEN (PERCOCET/ROXICET) 5-325 MG TABLET    Take 1 tablet by mouth every 8 (eight) hours as needed for severe pain.   I personally performed the services described in this documentation, which was scribed in my presence. The recorded  information has been reviewed and is accurate.     Derwood Kaplan, MD 02/05/17 1954

## 2017-02-05 NOTE — ED Triage Notes (Signed)
Pt endorses left lower dental pain, was seen at [facility] this morning and given prescriptions. Pt took tramadol and no relief. VSS.

## 2017-02-05 NOTE — ED Provider Notes (Signed)
[facility]-EMERGENCY DEPT Provider Note   CSN: [id number] Arrival date & time: 02/05/17  [id number]     History   Chief Complaint Chief Complaint  Patient presents with  . Dental Pain    HPI Troy Clayton is a 26 y.o. male.  HPI   Troy Clayton is a 26 y.o. male who presents to the Emergency Department complaining of Left upper and lower dental pain for several days. He states pain was worse this morning upon wakening. He is taking ibuprofen without relief. Gradually throbbing sharp sensation to his left face that radiates to the left ear. Pain is worse with chewing. He states he contacted his dentist this morning and schedule an appointment for next week. He denies facial swelling, fever, chills, neck pain and difficulty swallowing.    History reviewed. No pertinent past medical history.  Patient Active Problem List   Diagnosis Date Noted  . Mallet finger 08/17/2013    Past Surgical History:  Procedure Laterality Date  . FINGER SURGERY    . FINGER SURGERY         Home Medications    Prior to Admission medications   Medication Sig Start Date End Date Taking? Authorizing Provider  acetaminophen (TYLENOL) 500 MG tablet Take 1,000 mg by mouth every 6 (six) hours as needed for pain.    Historical Provider, MD  amoxicillin (AMOXIL) 500 MG capsule Take 2 capsules (1,000 mg total) by mouth once. For 10 days 02/14/15   Pauline Ausammy Sinia Antosh, PA-C  HYDROcodone-acetaminophen (NORCO/VICODIN) 5-325 MG per tablet Take one-two tabs po q 4-6 hrs prn pain 02/14/15   Maimouna Rondeau, PA-C  ibuprofen (ADVIL,MOTRIN) 200 MG tablet Take 200 mg by mouth every 6 (six) hours as needed for mild pain.    Historical Provider, MD    Family History History reviewed. No pertinent family history.  Social History Social History  Substance Use Topics  . Smoking status: Current Every Day Smoker    Packs/day: 0.50    Years: 3.00    Types: Cigarettes  . Smokeless tobacco: Never Used  . Alcohol use No      Allergies   Patient has no known allergies.   Review of Systems Review of Systems  Constitutional: Negative for appetite change and fever.  HENT: Positive for dental problem. Negative for congestion, facial swelling, sore throat and trouble swallowing.   Eyes: Negative for pain and visual disturbance.  Musculoskeletal: Negative for neck pain and neck stiffness.  Neurological: Negative for dizziness, facial asymmetry and headaches.  Hematological: Negative for adenopathy.  All other systems reviewed and are negative.    Physical Exam Updated Vital Signs BP 130/89 (BP Location: Left Arm)   Pulse 69   Temp 98.1 F (36.7 C) (Oral)   Resp 16   Ht 5\' 11"  (1.803 m)   Wt 90.7 kg   SpO2 100%   BMI 27.89 kg/m   Physical Exam  Constitutional: He is oriented to person, place, and time. He appears well-developed and well-nourished. No distress.  HENT:  Head: Normocephalic and atraumatic.  Right Ear: Tympanic membrane and ear canal normal.  Left Ear: Tympanic membrane and ear canal normal.  Mouth/Throat: Uvula is midline, oropharynx is clear and moist and mucous membranes are normal. No trismus in the jaw. Dental caries present. No dental abscesses or uvula swelling.  Tenderness and dental caries of the left upper second molar and left lower second and third molar.  No facial swelling, obvious dental abscess, trismus, or sublingual abnml.  Eyes: EOM are normal. Pupils are equal, round, and reactive to light.  Neck: Normal range of motion. Neck supple.  Cardiovascular: Normal rate, regular rhythm and normal heart sounds.   No murmur heard. Pulmonary/Chest: Effort normal and breath sounds normal.  Musculoskeletal: Normal range of motion.  Lymphadenopathy:    He has no cervical adenopathy.  Neurological: He is alert and oriented to person, place, and time. He exhibits normal muscle tone. Coordination normal.  Skin: Skin is warm and dry.  Nursing note and vitals  reviewed.    ED Treatments / Results  Labs (all labs ordered are listed, but only abnormal results are displayed) Labs Reviewed - No data to display  EKG  EKG Interpretation None       Radiology No results found.  Procedures Procedures (including critical care time)  Medications Ordered in ED Medications - No data to display   Initial Impression / Assessment and Plan / ED Course  I have reviewed the triage vital signs and the nursing notes.  Pertinent labs & imaging results that were available during my care of the patient were reviewed by me and considered in my medical decision making (see chart for details).     Patient well-appearing. No obvious dental abscess or concern for Ludwig's angina.  Pt has appt with dentist next week.  Rx for ultram and amoxil  Final Clinical Impressions(s) / ED Diagnoses   Final diagnoses:  Pain, dental    New Prescriptions New Prescriptions   No medications on file     Pauline Aus, PA-C 02/05/17 1009    Bethann Berkshire, MD 02/05/17 1545

## 2017-02-05 NOTE — Discharge Instructions (Signed)
We saw you in the ER for the toothache. No evidence of deep infection, no evidence of pus that can be drained out. YOU WILL NEED TO SEE A DENTIST to get appropriate care. Please take the antibiotics and pain meds provided. The pain medicine might not be very effective, as your pain is coming from dental nerve.  Return to the ER if there is drooling, difficulty breathing, difficulty opening mouth, fevers, chills, confusion, headaches, seizures.

## 2018-07-06 ENCOUNTER — Encounter (HOSPITAL_COMMUNITY): Payer: Self-pay

## 2018-07-06 ENCOUNTER — Emergency Department (HOSPITAL_COMMUNITY): Payer: Self-pay

## 2018-07-06 ENCOUNTER — Other Ambulatory Visit: Payer: Self-pay

## 2018-07-06 ENCOUNTER — Emergency Department (HOSPITAL_COMMUNITY)
Admission: EM | Admit: 2018-07-06 | Discharge: 2018-07-06 | Disposition: A | Discharge status: Home / Self Care | Payer: Self-pay | Attending: Emergency Medicine | Admitting: Emergency Medicine

## 2018-07-06 DIAGNOSIS — E860 Dehydration: Secondary | ICD-10-CM | POA: Insufficient documentation

## 2018-07-06 DIAGNOSIS — Z79899 Other long term (current) drug therapy: Secondary | ICD-10-CM | POA: Insufficient documentation

## 2018-07-06 DIAGNOSIS — F17210 Nicotine dependence, cigarettes, uncomplicated: Secondary | ICD-10-CM | POA: Insufficient documentation

## 2018-07-06 DIAGNOSIS — F1910 Other psychoactive substance abuse, uncomplicated: Secondary | ICD-10-CM | POA: Insufficient documentation

## 2018-07-06 DIAGNOSIS — T675XXA Heat exhaustion, unspecified, initial encounter: Secondary | ICD-10-CM | POA: Insufficient documentation

## 2018-07-06 HISTORY — DX: Anxiety disorder, unspecified: F41.9

## 2018-07-06 HISTORY — DX: Dehydration: E86.0

## 2018-07-06 LAB — COMPREHENSIVE METABOLIC PANEL
ALT: 11 U/L (ref 0–44)
AST: 19 U/L (ref 15–41)
Albumin: 4.1 g/dL (ref 3.5–5.0)
Alkaline Phosphatase: 89 U/L (ref 38–126)
Anion gap: 6 (ref 5–15)
BILIRUBIN TOTAL: 0.5 mg/dL (ref 0.3–1.2)
BUN: 12 mg/dL (ref 6–20)
CO2: 31 mmol/L (ref 22–32)
Calcium: 9.1 mg/dL (ref 8.9–10.3)
Chloride: 102 mmol/L (ref 98–111)
Creatinine, Ser: 1.19 mg/dL (ref 0.61–1.24)
Glucose, Bld: 107 mg/dL — ABNORMAL HIGH (ref 70–99)
Potassium: 4.1 mmol/L (ref 3.5–5.1)
Sodium: 139 mmol/L (ref 135–145)
TOTAL PROTEIN: 7.3 g/dL (ref 6.5–8.1)

## 2018-07-06 LAB — URINALYSIS, ROUTINE W REFLEX MICROSCOPIC
BACTERIA UA: NONE SEEN
Bilirubin Urine: NEGATIVE
Glucose, UA: NEGATIVE mg/dL
Ketones, ur: 5 mg/dL — AB
Leukocytes, UA: NEGATIVE
Nitrite: NEGATIVE
PROTEIN: 30 mg/dL — AB
RBC / HPF: >50 RBC/hpf — ABNORMAL HIGH (ref 0–5)
SPECIFIC GRAVITY, URINE: 1.018 (ref 1.005–1.030)
pH: 7 (ref 5.0–8.0)

## 2018-07-06 LAB — CBC WITH DIFFERENTIAL/PLATELET
Basophils Absolute: 0.1 10*3/uL (ref 0.0–0.1)
Basophils Relative: 0 %
Eosinophils Absolute: 0.1 10*3/uL (ref 0.0–0.7)
Eosinophils Relative: 0 %
HEMATOCRIT: 38.1 % — AB (ref 39.0–52.0)
Hemoglobin: 12.8 g/dL — ABNORMAL LOW (ref 13.0–17.0)
LYMPHS ABS: 1.1 10*3/uL (ref 0.7–4.0)
LYMPHS PCT: 9 %
MCH: 30.1 pg (ref 26.0–34.0)
MCHC: 33.6 g/dL (ref 30.0–36.0)
MCV: 89.6 fL (ref 78.0–100.0)
Monocytes Absolute: 0.7 10*3/uL (ref 0.1–1.0)
Monocytes Relative: 6 %
NEUTROS ABS: 9.6 10*3/uL — AB (ref 1.7–7.7)
Neutrophils Relative %: 85 %
PLATELETS: 289 10*3/uL (ref 150–400)
RBC: 4.25 MIL/uL (ref 4.22–5.81)
RDW: 13.2 % (ref 11.5–15.5)
WBC: 11.4 10*3/uL — ABNORMAL HIGH (ref 4.0–10.5)

## 2018-07-06 LAB — RAPID URINE DRUG SCREEN, HOSP PERFORMED
Amphetamines: NOT DETECTED
BENZODIAZEPINES: POSITIVE — AB
Cocaine: POSITIVE — AB
Opiates: NOT DETECTED
Tetrahydrocannabinol: POSITIVE — AB

## 2018-07-06 LAB — CBG MONITORING, ED: GLUCOSE-CAPILLARY: 108 mg/dL — AB (ref 70–99)

## 2018-07-06 LAB — CK: CK TOTAL: 226 U/L (ref 49–397)

## 2018-07-06 MED ORDER — SODIUM CHLORIDE 0.9 % IV SOLN
INTRAVENOUS | Status: DC
  Administered 2018-07-06: 16:00:00 via INTRAVENOUS

## 2018-07-06 MED ORDER — SODIUM CHLORIDE 0.9 % IV BOLUS
1000.0000 mL | Freq: Once | INTRAVENOUS | Status: AC
  Administered 2018-07-06: 1000 mL via INTRAVENOUS

## 2018-07-06 NOTE — ED Notes (Signed)
Pt aware a urine specimen is needed. Urinal left at bedside.  

## 2018-07-06 NOTE — ED Notes (Signed)
Pt states he still does not need to urinate.

## 2018-07-06 NOTE — ED Notes (Signed)
EDP at bedside  

## 2018-07-06 NOTE — ED Notes (Signed)
Pt's family member arrived.

## 2018-07-06 NOTE — ED Notes (Signed)
Pt states he does not need to urinate at this time, aware of DO  

## 2018-07-06 NOTE — ED Triage Notes (Signed)
EMS reports pt was working outside and witnesses say pt fell down and started having a seizure.  Denies history of seizures.  Reports has had sycnope from dehydration.  EMS gave 1liter NSS.  Reports initially pt was ST on monitor then changed to episodes alternating fast and slow HR.  Pt alert and oriented.

## 2018-07-06 NOTE — ED Provider Notes (Signed)
[facility] EMERGENCY DEPARTMENT Provider Note   CSN: [id number] Arrival date & time: 07/06/18  1455     History   Chief Complaint Chief Complaint  Patient presents with  . Seizures    HPI Troy Clayton is a 27 y.o. male.  Pt presents to the ED today with possible seizure.  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  The pt said he "fell out" at work.  His coworkers told EMS that it looked like he had a seizure.  No hx seizure.  He sustained no injury.  The pt has had syncope with dehydration in the past.  He said his whole body is hurting.       Past Medical History:  Diagnosis Date  . Anxiety   . Dehydration     Patient Active Problem List   Diagnosis Date Noted  . Mallet finger 08/17/2013    Past Surgical History:  Procedure Laterality Date  . FINGER SURGERY    . FINGER SURGERY          Home Medications    Prior to Admission medications   Medication Sig Start Date End Date Taking? Authorizing Provider  ibuprofen (ADVIL,MOTRIN) 200 MG tablet Take 200 mg by mouth every 6 (six) hours as needed for mild pain.   Yes [provider]  amoxicillin (AMOXIL) 500 MG capsule Take 1 capsule (500 mg total) by mouth 3 (three) times daily. For 10 days 02/05/17   Pauline Aus, PA-C    Family History No family history on file.  Social History Social History   Tobacco Use  . Smoking status: Current Every Day Smoker    Packs/day: 0.50    Years: 3.00    Pack years: 1.50    Types: Cigarettes  . Smokeless tobacco: Never Used  Substance Use Topics  . Alcohol use: No  . Drug use: Yes    Types: Marijuana    Comment: xanax     Allergies   Patient has no known allergies.   Review of Systems Review of Systems  Neurological: Positive for syncope.  All other systems reviewed and are negative.    Physical Exam Updated Vital Signs BP 113/74   Pulse 95   Resp 18   Ht 5\' 11"  (1.803 m)   Wt 90.7 kg (200 lb)    SpO2 99%   BMI 27.89 kg/m   Physical Exam  Constitutional: He is oriented to person, place, and time. He appears well-developed and well-nourished.  Pt's clothes were wet from sweat.  HENT:  Head: Normocephalic and atraumatic.  Right Ear: External ear normal.  Left Ear: External ear normal.  Nose: Nose normal.  Mouth/Throat: Mucous membranes are dry.  Eyes: Pupils are equal, round, and reactive to light. Conjunctivae and EOM are normal.  Neck: Normal range of motion. Neck supple.  Cardiovascular: Normal rate, regular rhythm, normal heart sounds and intact distal pulses.  Pulmonary/Chest: Effort normal and breath sounds normal.  Abdominal: Soft. Bowel sounds are normal.  Musculoskeletal: Normal range of motion.  Neurological: He is alert and oriented to person, place, and time.  Skin: Skin is warm. Capillary refill takes less than 2 seconds.  Psychiatric: He has a normal mood and affect. His behavior is normal. Judgment and thought content normal.  Nursing note and vitals reviewed.    ED Treatments / Results  Labs (all labs ordered are listed, but only abnormal results are displayed) Labs Reviewed  CBC WITH DIFFERENTIAL/PLATELET -  Abnormal; Notable for the following components:      Result Value   WBC 11.4 (*)    Hemoglobin 12.8 (*)    HCT 38.1 (*)    Neutro Abs 9.6 (*)    All other components within normal limits  COMPREHENSIVE METABOLIC PANEL - Abnormal; Notable for the following components:   Glucose, Bld 107 (*)    All other components within normal limits  RAPID URINE DRUG SCREEN, HOSP PERFORMED - Abnormal; Notable for the following components:   Cocaine POSITIVE (*)    Benzodiazepines POSITIVE (*)    Tetrahydrocannabinol POSITIVE (*)    Barbiturates   (*)    Value: Result not available. Reagent lot number recalled by manufacturer.   All other components within normal limits  URINALYSIS, ROUTINE W REFLEX MICROSCOPIC - Abnormal; Notable for the following components:     APPearance HAZY (*)    Hgb urine dipstick SMALL (*)    Ketones, ur 5 (*)    Protein, ur 30 (*)    RBC / HPF >50 (*)    All other components within normal limits  CBG MONITORING, ED - Abnormal; Notable for the following components:   Glucose-Capillary 108 (*)    All other components within normal limits  CK    EKG EKG Interpretation  Date/Time:  Tuesday July 06 2018 15:28:13 EDT Ventricular Rate:  56 PR Interval:    QRS Duration: 103 QT Interval:  470 QTC Calculation: 454 R Axis:   82 Text Interpretation:  Sinus rhythm Probable left atrial enlargement Baseline wander in lead(s) V1 No old tracing to compare Confirmed by Jacalyn Lefevre [phone]) on 07/06/2018 3:42:45 PM   Radiology Dg Chest 2 View  Result Date: 07/06/2018 CLINICAL DATA:  Syncope.  Dehydration. EXAM: CHEST - 2 VIEW COMPARISON:  Chest radiograph May 14, 2014 FINDINGS: Cardiomediastinal silhouette is normal. No pleural effusions or focal consolidations. Trachea projects midline and there is no pneumothorax. Soft tissue planes and included osseous structures are non-suspicious. IMPRESSION: Negative. Electronically Signed   By: Awilda Metro M.D.   On: 07/06/2018 16:32   Ct Head Wo Contrast  Result Date: 07/06/2018 CLINICAL DATA:  Fall.  Seizure.  Altered level of consciousness EXAM: CT HEAD WITHOUT CONTRAST TECHNIQUE: Contiguous axial images were obtained from the base of the skull through the vertex without intravenous contrast. COMPARISON:  CT head 03/21/2011 FINDINGS: Brain: No evidence of acute infarction, hemorrhage, hydrocephalus, extra-axial collection or mass lesion/mass effect. Small gas bubbles posterior to the clivus and on the right at C1 likely venous gas from vena puncture. Vascular: Normal arterial flow voids Skull: Negative for skull fracture Sinuses/Orbits: Mucosal edema paranasal sinuses. Complete opacification left maxillary sinus with bony thickening due to chronic disease Other: None IMPRESSION:  Negative CT of the head Chronic sinusitis Small gas bubbles in the skull base likely introduced to vena puncture. Electronically Signed   By: Marlan Palau M.D.   On: 07/06/2018 16:32    Procedures Procedures (including critical care time)  Medications Ordered in ED Medications  sodium chloride 0.9 % bolus 1,000 mL (0 mLs Intravenous Stopped 07/06/18 1712)    And  0.9 %  sodium chloride infusion ( Intravenous New Bag/Given 07/06/18 1538)     Initial Impression / Assessment and Plan / ED Course  I have reviewed the triage vital signs and the nursing notes.  Pertinent labs & imaging results that were available during my care of the patient were reviewed by me and considered in my medical decision  making (see chart for details).     Pt is feeling much better.  He is instructed to avoid cocaine and marijuana.  He is encouraged to drink lots of fluids.  Return if worse.  Final Clinical Impressions(s) / ED Diagnoses   Final diagnoses:  Heat exhaustion, initial encounter  Dehydration  Polysubstance abuse [facility](HCC)    ED Discharge Orders    None       Jacalyn LefevreHaviland, Ayce Pietrzyk, MD 07/06/18 1944

## 2018-07-06 NOTE — ED Notes (Signed)
Pt noted to be talking to himself constantly and will repeatedly ask, "am I in [place]?"

## 2018-07-06 NOTE — Discharge Instructions (Addendum)
Avoid cocaine and marijuana.  Drink lots of fluids if working outside.

## 2018-07-06 NOTE — ED Triage Notes (Signed)
Pt alert and able to answer questions but repeats himself a lot.  Pt says he occasionally takes xanax that is not prescribed to him and says has taken some recently but not on a regular basis.  EMS says pt admitted to smoking marijuana this morning but pt says he doesn't remember.

## 2018-07-06 NOTE — ED Notes (Signed)
Pt returned from radiology.
